# Patient Record
Sex: Male | Born: 1945 | Race: White | Hispanic: No | Marital: Married | Smoking: Current some day smoker
Health system: Southern US, Community
[De-identification: ages and names within clinical notes are randomized; demographics above are authoritative.]

## PROBLEM LIST (undated history)

## (undated) DIAGNOSIS — D649 Anemia, unspecified: Secondary | ICD-10-CM

## (undated) DIAGNOSIS — M545 Low back pain, unspecified: Secondary | ICD-10-CM

## (undated) DIAGNOSIS — M109 Gout, unspecified: Secondary | ICD-10-CM

## (undated) DIAGNOSIS — I10 Essential (primary) hypertension: Secondary | ICD-10-CM

## (undated) DIAGNOSIS — G4733 Obstructive sleep apnea (adult) (pediatric): Secondary | ICD-10-CM

## (undated) DIAGNOSIS — T4145XA Adverse effect of unspecified anesthetic, initial encounter: Secondary | ICD-10-CM

## (undated) DIAGNOSIS — E119 Type 2 diabetes mellitus without complications: Secondary | ICD-10-CM

## (undated) DIAGNOSIS — T8859XA Other complications of anesthesia, initial encounter: Secondary | ICD-10-CM

## (undated) DIAGNOSIS — I2510 Atherosclerotic heart disease of native coronary artery without angina pectoris: Secondary | ICD-10-CM

## (undated) DIAGNOSIS — G8929 Other chronic pain: Secondary | ICD-10-CM

## (undated) DIAGNOSIS — M1990 Unspecified osteoarthritis, unspecified site: Secondary | ICD-10-CM

## (undated) DIAGNOSIS — S02609A Fracture of mandible, unspecified, initial encounter for closed fracture: Secondary | ICD-10-CM

## (undated) DIAGNOSIS — Z8711 Personal history of peptic ulcer disease: Secondary | ICD-10-CM

## (undated) DIAGNOSIS — Z9289 Personal history of other medical treatment: Secondary | ICD-10-CM

## (undated) DIAGNOSIS — Z9989 Dependence on other enabling machines and devices: Secondary | ICD-10-CM

## (undated) DIAGNOSIS — Z8719 Personal history of other diseases of the digestive system: Secondary | ICD-10-CM

## (undated) DIAGNOSIS — J189 Pneumonia, unspecified organism: Secondary | ICD-10-CM

## (undated) HISTORY — PX: BACK SURGERY: SHX140

## (undated) HISTORY — PX: COLON SURGERY: SHX602

## (undated) HISTORY — DX: Essential (primary) hypertension: I10

## (undated) HISTORY — PX: CORNEAL TRANSPLANT: SHX108

## (undated) HISTORY — PX: ELBOW SURGERY: SHX618

## (undated) HISTORY — PX: HEMICOLECTOMY: SHX854

## (undated) HISTORY — DX: Atherosclerotic heart disease of native coronary artery without angina pectoris: I25.10

## (undated) HISTORY — PX: DEBRIDEMENT OF ABDOMINAL WALL ABSCESS: SHX6396

## (undated) HISTORY — PX: WRIST SURGERY: SHX841

---

## 1997-03-19 HISTORY — PX: CORONARY ARTERY BYPASS GRAFT: SHX141

## 1997-07-12 ENCOUNTER — Inpatient Hospital Stay (HOSPITAL_COMMUNITY): Admission: EM | Admit: 1997-07-12 | Discharge: 1997-07-22 | Payer: Self-pay | Admitting: Emergency Medicine

## 1998-11-18 HISTORY — PX: LUMBAR SPINE SURGERY: SHX701

## 1999-03-08 ENCOUNTER — Emergency Department (HOSPITAL_COMMUNITY): Admission: EM | Admit: 1999-03-08 | Discharge: 1999-03-08 | Payer: Self-pay | Admitting: Emergency Medicine

## 1999-03-08 ENCOUNTER — Encounter: Payer: Self-pay | Admitting: Emergency Medicine

## 1999-06-27 ENCOUNTER — Inpatient Hospital Stay (HOSPITAL_COMMUNITY): Admission: EM | Admit: 1999-06-27 | Discharge: 1999-06-30 | Payer: Self-pay | Admitting: *Deleted

## 2000-11-04 ENCOUNTER — Encounter: Payer: Self-pay | Admitting: Gastroenterology

## 2000-11-04 ENCOUNTER — Ambulatory Visit (HOSPITAL_COMMUNITY): Admission: RE | Admit: 2000-11-04 | Discharge: 2000-11-04 | Payer: Self-pay | Admitting: Gastroenterology

## 2000-11-05 ENCOUNTER — Inpatient Hospital Stay (HOSPITAL_COMMUNITY): Admission: EM | Admit: 2000-11-05 | Discharge: 2000-11-14 | Payer: Self-pay | Admitting: Gastroenterology

## 2000-11-08 ENCOUNTER — Encounter: Payer: Self-pay | Admitting: Gastroenterology

## 2000-11-13 ENCOUNTER — Encounter (HOSPITAL_BASED_OUTPATIENT_CLINIC_OR_DEPARTMENT_OTHER): Payer: Self-pay | Admitting: General Surgery

## 2000-11-21 ENCOUNTER — Encounter (HOSPITAL_BASED_OUTPATIENT_CLINIC_OR_DEPARTMENT_OTHER): Payer: Self-pay | Admitting: General Surgery

## 2000-11-21 ENCOUNTER — Ambulatory Visit (HOSPITAL_COMMUNITY): Admission: RE | Admit: 2000-11-21 | Discharge: 2000-11-21 | Payer: Self-pay | Admitting: General Surgery

## 2000-12-10 ENCOUNTER — Encounter (INDEPENDENT_AMBULATORY_CARE_PROVIDER_SITE_OTHER): Payer: Self-pay | Admitting: *Deleted

## 2000-12-10 ENCOUNTER — Other Ambulatory Visit: Admission: RE | Admit: 2000-12-10 | Discharge: 2000-12-10 | Payer: Self-pay | Admitting: Gastroenterology

## 2000-12-12 ENCOUNTER — Encounter (HOSPITAL_BASED_OUTPATIENT_CLINIC_OR_DEPARTMENT_OTHER): Payer: Self-pay | Admitting: General Surgery

## 2000-12-16 ENCOUNTER — Encounter (INDEPENDENT_AMBULATORY_CARE_PROVIDER_SITE_OTHER): Payer: Self-pay | Admitting: Specialist

## 2000-12-16 ENCOUNTER — Inpatient Hospital Stay (HOSPITAL_COMMUNITY): Admission: RE | Admit: 2000-12-16 | Discharge: 2000-12-24 | Payer: Self-pay | Admitting: General Surgery

## 2001-12-16 ENCOUNTER — Encounter: Admission: RE | Admit: 2001-12-16 | Discharge: 2001-12-16 | Payer: Self-pay

## 2001-12-17 ENCOUNTER — Inpatient Hospital Stay (HOSPITAL_COMMUNITY): Admission: AD | Admit: 2001-12-17 | Discharge: 2001-12-20 | Payer: Self-pay | Admitting: General Surgery

## 2001-12-18 ENCOUNTER — Encounter (HOSPITAL_BASED_OUTPATIENT_CLINIC_OR_DEPARTMENT_OTHER): Payer: Self-pay | Admitting: General Surgery

## 2001-12-26 ENCOUNTER — Encounter (HOSPITAL_BASED_OUTPATIENT_CLINIC_OR_DEPARTMENT_OTHER): Payer: Self-pay | Admitting: General Surgery

## 2001-12-26 ENCOUNTER — Ambulatory Visit (HOSPITAL_COMMUNITY): Admission: RE | Admit: 2001-12-26 | Discharge: 2001-12-26 | Payer: Self-pay | Admitting: General Surgery

## 2002-01-23 ENCOUNTER — Ambulatory Visit (HOSPITAL_COMMUNITY): Admission: RE | Admit: 2002-01-23 | Discharge: 2002-01-23 | Payer: Self-pay | Admitting: Gastroenterology

## 2002-01-23 ENCOUNTER — Encounter: Payer: Self-pay | Admitting: Gastroenterology

## 2002-03-17 ENCOUNTER — Encounter (HOSPITAL_BASED_OUTPATIENT_CLINIC_OR_DEPARTMENT_OTHER): Payer: Self-pay | Admitting: General Surgery

## 2002-03-17 ENCOUNTER — Ambulatory Visit (HOSPITAL_COMMUNITY): Admission: RE | Admit: 2002-03-17 | Discharge: 2002-03-17 | Payer: Self-pay | Admitting: General Surgery

## 2002-04-27 ENCOUNTER — Emergency Department (HOSPITAL_COMMUNITY): Admission: EM | Admit: 2002-04-27 | Discharge: 2002-04-27 | Payer: Self-pay | Admitting: Emergency Medicine

## 2002-04-27 ENCOUNTER — Encounter (HOSPITAL_BASED_OUTPATIENT_CLINIC_OR_DEPARTMENT_OTHER): Payer: Self-pay | Admitting: General Surgery

## 2002-05-08 ENCOUNTER — Ambulatory Visit (HOSPITAL_COMMUNITY): Admission: RE | Admit: 2002-05-08 | Discharge: 2002-05-08 | Payer: Self-pay | Admitting: General Surgery

## 2002-05-08 ENCOUNTER — Encounter (HOSPITAL_BASED_OUTPATIENT_CLINIC_OR_DEPARTMENT_OTHER): Payer: Self-pay | Admitting: General Surgery

## 2002-05-08 ENCOUNTER — Encounter (INDEPENDENT_AMBULATORY_CARE_PROVIDER_SITE_OTHER): Payer: Self-pay | Admitting: *Deleted

## 2002-05-13 ENCOUNTER — Encounter (HOSPITAL_BASED_OUTPATIENT_CLINIC_OR_DEPARTMENT_OTHER): Payer: Self-pay | Admitting: General Surgery

## 2002-05-13 ENCOUNTER — Ambulatory Visit (HOSPITAL_COMMUNITY): Admission: RE | Admit: 2002-05-13 | Discharge: 2002-05-13 | Payer: Self-pay | Admitting: General Surgery

## 2002-11-26 ENCOUNTER — Encounter: Payer: Self-pay | Admitting: Emergency Medicine

## 2002-11-26 ENCOUNTER — Inpatient Hospital Stay (HOSPITAL_COMMUNITY): Admission: EM | Admit: 2002-11-26 | Discharge: 2002-11-28 | Payer: Self-pay | Admitting: Emergency Medicine

## 2003-01-11 ENCOUNTER — Encounter: Payer: Self-pay | Admitting: Gastroenterology

## 2003-01-11 ENCOUNTER — Ambulatory Visit (HOSPITAL_COMMUNITY): Admission: RE | Admit: 2003-01-11 | Discharge: 2003-01-11 | Payer: Self-pay | Admitting: Gastroenterology

## 2003-01-12 ENCOUNTER — Ambulatory Visit (HOSPITAL_COMMUNITY): Admission: RE | Admit: 2003-01-12 | Discharge: 2003-01-12 | Payer: Self-pay | Admitting: Gastroenterology

## 2004-01-13 ENCOUNTER — Encounter: Admission: RE | Admit: 2004-01-13 | Discharge: 2004-01-13 | Payer: Self-pay | Admitting: Family Medicine

## 2004-03-22 ENCOUNTER — Ambulatory Visit: Payer: Self-pay | Admitting: Family Medicine

## 2004-04-26 ENCOUNTER — Ambulatory Visit: Payer: Self-pay | Admitting: Family Medicine

## 2004-05-10 ENCOUNTER — Ambulatory Visit: Payer: Self-pay | Admitting: Family Medicine

## 2004-10-25 ENCOUNTER — Ambulatory Visit: Payer: Self-pay | Admitting: Cardiology

## 2004-10-26 ENCOUNTER — Ambulatory Visit: Payer: Self-pay

## 2004-11-23 ENCOUNTER — Ambulatory Visit: Payer: Self-pay | Admitting: Pulmonary Disease

## 2004-11-23 ENCOUNTER — Ambulatory Visit (HOSPITAL_COMMUNITY): Admission: RE | Admit: 2004-11-23 | Discharge: 2004-11-23 | Payer: Self-pay | Admitting: Orthopedic Surgery

## 2004-11-23 ENCOUNTER — Ambulatory Visit: Payer: Self-pay | Admitting: Family Medicine

## 2004-11-24 ENCOUNTER — Ambulatory Visit: Payer: Self-pay | Admitting: Family Medicine

## 2004-11-29 ENCOUNTER — Ambulatory Visit: Payer: Self-pay | Admitting: Family Medicine

## 2004-12-20 ENCOUNTER — Ambulatory Visit (HOSPITAL_COMMUNITY): Admission: RE | Admit: 2004-12-20 | Discharge: 2004-12-21 | Payer: Self-pay | Admitting: Orthopedic Surgery

## 2005-01-24 ENCOUNTER — Ambulatory Visit: Payer: Self-pay | Admitting: Family Medicine

## 2005-02-01 ENCOUNTER — Encounter: Admission: RE | Admit: 2005-02-01 | Discharge: 2005-02-01 | Payer: Self-pay | Admitting: Orthopedic Surgery

## 2005-03-06 ENCOUNTER — Ambulatory Visit: Payer: Self-pay | Admitting: Gastroenterology

## 2005-03-20 ENCOUNTER — Ambulatory Visit: Payer: Self-pay | Admitting: Family Medicine

## 2005-03-26 ENCOUNTER — Encounter (INDEPENDENT_AMBULATORY_CARE_PROVIDER_SITE_OTHER): Payer: Self-pay | Admitting: *Deleted

## 2005-03-26 ENCOUNTER — Inpatient Hospital Stay (HOSPITAL_COMMUNITY): Admission: RE | Admit: 2005-03-26 | Discharge: 2005-04-02 | Payer: Self-pay | Admitting: Orthopedic Surgery

## 2005-04-12 ENCOUNTER — Ambulatory Visit: Payer: Self-pay | Admitting: Family Medicine

## 2005-07-24 ENCOUNTER — Ambulatory Visit: Payer: Self-pay | Admitting: Family Medicine

## 2005-10-01 ENCOUNTER — Ambulatory Visit: Payer: Self-pay | Admitting: Family Medicine

## 2005-11-06 ENCOUNTER — Ambulatory Visit: Payer: Self-pay | Admitting: Family Medicine

## 2006-04-24 ENCOUNTER — Ambulatory Visit: Payer: Self-pay | Admitting: Family Medicine

## 2006-04-24 LAB — CONVERTED CEMR LAB
ALT: 25 units/L (ref 0–40)
AST: 41 units/L — ABNORMAL HIGH (ref 0–37)
BUN: 17 mg/dL (ref 6–23)
Basophils Absolute: 0.1 10*3/uL (ref 0.0–0.1)
Basophils Relative: 0.9 % (ref 0.0–1.0)
CO2: 26 meq/L (ref 19–32)
Calcium: 9.1 mg/dL (ref 8.4–10.5)
Chloride: 107 meq/L (ref 96–112)
Creatinine, Ser: 1 mg/dL (ref 0.4–1.5)
Creatinine,U: 124 mg/dL
Eosinophils Absolute: 0.3 10*3/uL (ref 0.0–0.6)
Eosinophils Relative: 3.4 % (ref 0.0–5.0)
GFR calc Af Amer: 99 mL/min
GFR calc non Af Amer: 82 mL/min
Glucose, Bld: 81 mg/dL (ref 70–99)
HCT: 39.3 % (ref 39.0–52.0)
Hemoglobin: 13.6 g/dL (ref 13.0–17.0)
Hgb A1c MFr Bld: 8.6 % — ABNORMAL HIGH (ref 4.6–6.0)
Lymphocytes Relative: 25.8 % (ref 12.0–46.0)
MCHC: 34.5 g/dL (ref 30.0–36.0)
MCV: 88.9 fL (ref 78.0–100.0)
Microalb Creat Ratio: 660.5 mg/g — ABNORMAL HIGH (ref 0.0–30.0)
Microalb, Ur: 81.9 mg/dL (ref 0.0–1.9)
Monocytes Absolute: 0.8 10*3/uL — ABNORMAL HIGH (ref 0.2–0.7)
Monocytes Relative: 10.6 % (ref 3.0–11.0)
Neutro Abs: 4.4 10*3/uL (ref 1.4–7.7)
Neutrophils Relative %: 59.3 % (ref 43.0–77.0)
PSA: 0.5 ng/mL (ref 0.10–4.00)
Platelets: 198 10*3/uL (ref 150–400)
Potassium: 4 meq/L (ref 3.5–5.1)
RBC: 4.42 M/uL (ref 4.22–5.81)
RDW: 14.3 % (ref 11.5–14.6)
Sodium: 140 meq/L (ref 135–145)
TSH: 2.83 microintl units/mL (ref 0.35–5.50)
Uric Acid, Serum: 6.1 mg/dL (ref 2.4–7.0)
Vitamin B-12: 327 pg/mL (ref 211–911)
WBC: 7.6 10*3/uL (ref 4.5–10.5)

## 2006-11-04 ENCOUNTER — Encounter: Payer: Self-pay | Admitting: Family Medicine

## 2006-11-04 DIAGNOSIS — K219 Gastro-esophageal reflux disease without esophagitis: Secondary | ICD-10-CM

## 2006-11-04 DIAGNOSIS — E785 Hyperlipidemia, unspecified: Secondary | ICD-10-CM

## 2006-11-04 DIAGNOSIS — K5730 Diverticulosis of large intestine without perforation or abscess without bleeding: Secondary | ICD-10-CM | POA: Insufficient documentation

## 2006-11-04 DIAGNOSIS — E119 Type 2 diabetes mellitus without complications: Secondary | ICD-10-CM

## 2006-11-04 DIAGNOSIS — I10 Essential (primary) hypertension: Secondary | ICD-10-CM | POA: Insufficient documentation

## 2006-11-07 ENCOUNTER — Ambulatory Visit: Payer: Self-pay | Admitting: Family Medicine

## 2006-11-07 DIAGNOSIS — G4730 Sleep apnea, unspecified: Secondary | ICD-10-CM | POA: Insufficient documentation

## 2006-11-07 DIAGNOSIS — E1149 Type 2 diabetes mellitus with other diabetic neurological complication: Secondary | ICD-10-CM

## 2006-11-08 LAB — CONVERTED CEMR LAB
ALT: 20 units/L (ref 0–53)
AST: 30 units/L (ref 0–37)
Albumin: 3.7 g/dL (ref 3.5–5.2)
Alkaline Phosphatase: 84 units/L (ref 39–117)
Bilirubin, Direct: 0.1 mg/dL (ref 0.0–0.3)
Cholesterol: 161 mg/dL (ref 0–200)
Direct LDL: 98.4 mg/dL
HDL: 25 mg/dL — ABNORMAL LOW (ref >39.0)
Hgb A1c MFr Bld: 9.1 % — ABNORMAL HIGH (ref 4.6–6.0)
Total Bilirubin: 0.6 mg/dL (ref 0.3–1.2)
Total CHOL/HDL Ratio: 6.4
Total Protein: 6.6 g/dL (ref 6.0–8.3)
Triglycerides: 244 mg/dL (ref 0–149)
VLDL: 49 mg/dL — ABNORMAL HIGH (ref 0–40)

## 2006-11-21 ENCOUNTER — Encounter: Payer: Self-pay | Admitting: Family Medicine

## 2006-11-21 ENCOUNTER — Ambulatory Visit (HOSPITAL_BASED_OUTPATIENT_CLINIC_OR_DEPARTMENT_OTHER): Admission: RE | Admit: 2006-11-21 | Discharge: 2006-11-21 | Payer: Self-pay | Admitting: Family Medicine

## 2006-12-04 ENCOUNTER — Ambulatory Visit: Payer: Self-pay | Admitting: Pulmonary Disease

## 2006-12-24 ENCOUNTER — Telehealth: Payer: Self-pay | Admitting: Family Medicine

## 2007-01-02 ENCOUNTER — Ambulatory Visit: Payer: Self-pay | Admitting: Pulmonary Disease

## 2007-01-30 ENCOUNTER — Ambulatory Visit: Payer: Self-pay | Admitting: Pulmonary Disease

## 2007-01-30 DIAGNOSIS — G4733 Obstructive sleep apnea (adult) (pediatric): Secondary | ICD-10-CM

## 2007-04-10 ENCOUNTER — Ambulatory Visit: Payer: Self-pay | Admitting: Family Medicine

## 2007-04-10 DIAGNOSIS — N401 Enlarged prostate with lower urinary tract symptoms: Secondary | ICD-10-CM

## 2007-04-10 DIAGNOSIS — N390 Urinary tract infection, site not specified: Secondary | ICD-10-CM | POA: Insufficient documentation

## 2007-04-10 DIAGNOSIS — N410 Acute prostatitis: Secondary | ICD-10-CM

## 2007-04-10 LAB — CONVERTED CEMR LAB
Blood in Urine, dipstick: NEGATIVE
Nitrite: NEGATIVE
Specific Gravity, Urine: 1.025
Urobilinogen, UA: 0.2
WBC Urine, dipstick: NEGATIVE
pH: 5.5

## 2007-04-15 ENCOUNTER — Telehealth: Payer: Self-pay | Admitting: Family Medicine

## 2007-04-17 ENCOUNTER — Ambulatory Visit: Payer: Self-pay | Admitting: Gastroenterology

## 2007-04-24 ENCOUNTER — Ambulatory Visit: Payer: Self-pay | Admitting: Gastroenterology

## 2007-04-24 ENCOUNTER — Encounter: Payer: Self-pay | Admitting: Family Medicine

## 2007-04-29 ENCOUNTER — Ambulatory Visit: Payer: Self-pay | Admitting: Family Medicine

## 2007-04-29 DIAGNOSIS — E663 Overweight: Secondary | ICD-10-CM | POA: Insufficient documentation

## 2007-05-08 LAB — CONVERTED CEMR LAB
BUN: 12 mg/dL (ref 6–23)
CO2: 30 meq/L (ref 19–32)
Calcium: 9.6 mg/dL (ref 8.4–10.5)
Chloride: 102 meq/L (ref 96–112)
Cholesterol: 177 mg/dL (ref 0–200)
Creatinine, Ser: 1.2 mg/dL (ref 0.4–1.5)
Direct LDL: 93.6 mg/dL
GFR calc Af Amer: 80 mL/min
GFR calc non Af Amer: 66 mL/min
Glucose, Bld: 300 mg/dL — ABNORMAL HIGH (ref 70–99)
HDL: 31 mg/dL — ABNORMAL LOW (ref >39.0)
Hgb A1c MFr Bld: 14.6 % — ABNORMAL HIGH (ref 4.6–6.0)
PSA: 0.52 ng/mL (ref 0.10–4.00)
Potassium: 5.2 meq/L — ABNORMAL HIGH (ref 3.5–5.1)
Sodium: 139 meq/L (ref 135–145)
Total CHOL/HDL Ratio: 5.7
Triglycerides: 247 mg/dL (ref 0–149)
VLDL: 49 mg/dL — ABNORMAL HIGH (ref 0–40)

## 2007-05-21 ENCOUNTER — Telehealth: Payer: Self-pay | Admitting: Family Medicine

## 2007-05-28 ENCOUNTER — Encounter: Admission: RE | Admit: 2007-05-28 | Discharge: 2007-06-19 | Payer: Self-pay | Admitting: Endocrinology

## 2007-06-17 ENCOUNTER — Ambulatory Visit: Payer: Self-pay | Admitting: Family Medicine

## 2007-06-17 DIAGNOSIS — J189 Pneumonia, unspecified organism: Secondary | ICD-10-CM

## 2007-06-18 ENCOUNTER — Inpatient Hospital Stay (HOSPITAL_COMMUNITY): Admission: EM | Admit: 2007-06-18 | Discharge: 2007-06-22 | Payer: Self-pay | Admitting: Emergency Medicine

## 2007-06-18 ENCOUNTER — Telehealth: Payer: Self-pay | Admitting: Family Medicine

## 2007-06-18 ENCOUNTER — Ambulatory Visit: Payer: Self-pay | Admitting: Family Medicine

## 2007-06-18 ENCOUNTER — Ambulatory Visit: Payer: Self-pay | Admitting: Internal Medicine

## 2007-06-18 DIAGNOSIS — R091 Pleurisy: Secondary | ICD-10-CM | POA: Insufficient documentation

## 2007-06-18 DIAGNOSIS — J159 Unspecified bacterial pneumonia: Secondary | ICD-10-CM | POA: Insufficient documentation

## 2007-06-25 ENCOUNTER — Telehealth: Payer: Self-pay | Admitting: Family Medicine

## 2007-06-26 ENCOUNTER — Ambulatory Visit: Payer: Self-pay | Admitting: Family Medicine

## 2007-06-26 DIAGNOSIS — J984 Other disorders of lung: Secondary | ICD-10-CM

## 2007-07-25 ENCOUNTER — Encounter: Payer: Self-pay | Admitting: Family Medicine

## 2008-01-26 ENCOUNTER — Encounter: Payer: Self-pay | Admitting: Family Medicine

## 2010-04-08 ENCOUNTER — Encounter: Payer: Self-pay | Admitting: Gastroenterology

## 2010-04-08 ENCOUNTER — Encounter: Payer: Self-pay | Admitting: Orthopedic Surgery

## 2010-08-01 NOTE — Procedures (Signed)
NAMEMARKAS, ALDREDGE                  ACCOUNT NO.:  [id number]   MEDICAL RECORD NO.:  [id number]          PATIENT TYPE:  OUT   LOCATION:  SLEEP CENTER                 FACILITY:  [facility]   PHYSICIAN:  Barbaraann Share, MD,FCCPDATE OF BIRTH:  Mar 13[birth year] 1948   DATE OF STUDY:  11/21/2006                            NOCTURNAL POLYSOMNOGRAM   REFERRING PHYSICIAN:   REFERRING PHYSICIAN:  Tawny Asal, M.D.   INDICATION FOR STUDY:  Hypersomnia with sleep apnea.   EPWORTH SLEEPINESS SCORE:  17   MEDICATIONS:   SLEEP ARCHITECTURE:  The patient had a total sleep time of 316 minutes  with no slow wave sleep and on 31 minutes of REM.  Sleep onset latency  was prolonged at 39 minutes and REM onset was fairly rapid at 28  minutes.  Sleep efficiency was decreased at 83%.   RESPIRATORY DATA:  The patient was found to have 310 obstructive events  which were not positional in nature.  Very loud snoring was noted  throughout.   OXYGEN DATA:  There was O2 desaturation as low as 71% with the patient's  obstructive events.   CARDIAC DATA:  Occasional PAC's, but no clinically significant  arrhythmia.   MOVEMENT-PARASOMNIA:  Moderate numbers of leg jerks with no significant  sleep disruption.   IMPRESSIONS-RECOMMENDATIONS:  1. Severe obstructive sleep apnea with an apnea/hypopnea index of 59      events per hour and O2 desaturation as low as 71%.  Treatment for      this degree of sleep apnea should focus primarily on weight loss as      well as CPAP.  2. Occasional PAC's but no clinically significant arrhythmias were      noted.     Barbaraann Share, MD,FCCP  Diplomate, American Board of Sleep  Medicine  Electronically Signed    [facility]/MEDQ  D:  12/04/2006 08:31:11  T:  12/04/2006 12:01:04  Job:  [id number]

## 2010-08-01 NOTE — Discharge Summary (Signed)
NAMELERON, STOFFERS                  ACCOUNT NO.:  [id number]   MEDICAL RECORD NO.:  [id number]          PATIENT TYPE:  INP   LOCATION:  4704                         FACILITY:  [facility]   PHYSICIAN:  Willow Ora, MD           DATE OF BIRTH:  24-Mar-1945   DATE OF ADMISSION:  06/18/2007  DATE OF DISCHARGE:  06/22/2007                               DISCHARGE SUMMARY   BRIEF HISTORY AND PHYSICAL:  Mr. Merfeld is a 65 year old gentleman with  history of hypertension, coronary artery disease, diabetes, and  hyperlipidemia who was sent to the emergency room by his primary care  doctor with fever, productive cough, and malaise for several days.  Chest x-ray done at the office revealed a left lower lobe pneumonia.   PHYSICAL EXAMINATION:  GENERAL:  The patient was alert, oriented, in no  apparent distress.  VITAL SIGNS:  Blood pressure 158/89, heart rate 93, respiratory rate 24,  temperature 100.2, and O2 saturation 92% on room air.  LUNGS:  Scattered rhonchi with end-expiratory wheezing.  CARDIOVASCULAR:  Regular rate and rhythm without murmur.  ABDOMEN:  Obese, nontender, and nondistended.   LABORATORY AND X-RAYS:  Initial white count was 10.6 with a hemoglobin  of 14.0 and platelets of 153.  At the time of discharge, his hemoglobin  was 12 and the white blood count was 7.5.  Potassium 3.8, creatinine  1.29, blood sugar was 413.  LFTs were normal, three sets of cardiac  enzymes were negative.  His total cholesterol was 114, LDL 49, and HDL  22.  Uric acid level was 5.7, BNP was 90, A1c was 11.6.  Report of the  chest x-ray is as follows, he has infiltrate at the left base and a  stable small nodular density at the left upper lobe.   HOSPITAL COURSE:  1. The patient was admitted to the hospital with pneumonia.  He was      started on IV Rocephin and Zithromax.  His hospital stay was      unremarkable.  We noted the chest x-ray not only to show a      pneumonia but a nodular density.  He was  eventually switched to      p.o. antibiotics and doing well.  He is stable from that standpoint      and will be discharged home today.  2. Diabetes.  His diabetes is chronically under poor control.  The      patient stated that before admission, his endocrinologist started      on a shot probably Byetta; however, in the hospital, his sugars      were in the 300 range.  We started him on Lantus 10 units and      escalated the dose to 25 units.  Despite this increased dose of      Lantus, his sugar remain in the 300 range, and this will have to be      followed up as an outpatient.  3. He has a history of hypertension and this has been stable.  4.  History of coronary artery disease and this has been stable.  5. History of sleep apnea.  We will continue with CPAP upon admission.  6. The day before admission, he developed podagra on the right side.      He states that he had gout before.  He was started on Indocin and      colchicine.  By the time of rounds this morning, the base of this      right first great toe was slightly red and tender.  He will be      discharged on colchicine and Indocin.  7. He will be discharged with the following instructions,      a.     Simvastatin 80 mg once a day.      b.     Aspirin 325 mg once a day.      c.     Metoprolol 200 mg half tablet twice a day.      d.     Flomax once a day.      e.     Lyrica 300 mg twice a day.      f.     Mucinex DM twice a day for 1 week.      g.     Lantus 24 units daily.      h.     Albuterol 2 puffs every 4 hours as needed for wheezing.      i.     Metformin 500 mg twice a day.      j.     Ceftin 500 mg one tablet twice a day for 1 week.      k.     Colchicine 0.6 mg once every 3 hours as needed for gout.      l.     Indocin 25 mg one every 8 hours as needed for gout.      m.     CPAP as before.      n.     Come back to the ER if he feels short of breath or has high       fever.      o.     He is advised to call Dr.  Charmian Muff office tomorrow and set       up an appointment to be seen in the next few days.   ADMITTING DIAGNOSIS:  Pneumonia.   DISCHARGE DIAGNOSES:  1. Pneumonia, now on p.o. antibiotics.  2. Abnormal chest x-ray, not only he has an infiltrate, but he also      has left upper lobe nodular density that needs to be followed up as      an outpatient.  3. Diabetes, poorly controlled.  4. Hypertension, coronary artery disease, stable.  5. Episode of gout while in the hospital.  6. High cholesterol, well controlled except for HDL that is low.      Willow Ora, MD  Electronically Signed     JP/MEDQ  D:  06/22/2007  T:  06/22/2007  Job:  [id number]

## 2010-08-01 NOTE — Assessment & Plan Note (Signed)
[facility]                             PULMONARY OFFICE NOTE   NAME:[name], [name]                         MRN:          [id number]  DATE:01/02/2007                            DOB:          Apr 10, 1945    SLEEP MEDICINE CONSULTATION   HISTORY OF PRESENT ILLNESS:  The patient is a 65 year old male, who I  have been asked to see for obstructive sleep apnea.  The patient  underwent nocturnal polysomnography on November 21, 2006, where he was  found to have severe obstructive sleep apnea with a respiratory  disturbance index of 59 events per hour and O2 desaturation as low as  71%.  The patient states that he does have snoring and has been told  that he has pauses in his breathing during sleep.  He typically gets to  be bed between 8 and 10 and gets up at 6:15 to start his day, he is not  rested upon arising, he does note frequent awakenings, but no jerking  arousals.  The patient works as an Chief Operating Officer.  He will doze off at work very quickly with any period of  inactivity, and also cannot stay awake during meetings.  He will fall  asleep with TV and movies.  The patient states he has even fallen asleep  in Court.  He also has sleepiness with driving.  Of note, his weight is  up about 35 pounds over the last two years.   PAST MEDICAL HISTORY:  1. Hypertension.  2. Diabetes.  3. Dyslipidemia.  4. History of multiple spine surgeries.  5. History of CABG in 1997.   CURRENT MEDICATIONS:  1. Aspirin 325 daily.  2. Metoprolol-ER 200 mg one-half daily.  3. Jenuvia 100 mg daily.  4. Avandaryl 4/4 one b.i.d.  5. Simvastatin 80 mg q.h.s.  6. B-12 shots monthly.   The patient has INTOLERANCE TO MORPHINE, which causes a rash.   SOCIAL HISTORY:  He is married and has no children, he has a history of  smoking one pack per day for 40 years, he continues to smoke.   FAMILY HISTORY:  Unknown since he is adopted.   REVIEW OF  SYSTEMS:  As per History of Present Illness, also see Patient  Intake Form documented in the chart.   PHYSICAL EXAMINATION:  GENERAL:  He is an overweight male in no acute  distress.  VITAL SIGNS:  Blood pressure is 130/74, pulse 64, temperature is 98,  weight is 219 pounds, he is 5 foot 10 inches tall, O2 saturation on room  air is 92%.  HEENT:  Pupils equal, round, and reactive to light and accommodation,  extraocular muscles are intact, nares are somewhat narrowed but patent  with deviated septum to the left, oropharynx shows significant  elongation of the soft palate and uvula.  NECK:  Supple without JVD or lymphadenopathy, there is no palpable  thyromegaly.  CHEST:  Clear except for a few rhonchi.  CARDIAC:  Regular rate and rhythm.  ABDOMEN:  Soft and nontender with good bowel sounds.  GENITAL  EXAM, RECTAL EXAM, BREAST EXAM:  Not done and not indicated.  LOWER EXTREMITIES:  Without edema, pulses were intact distally.  NEUROLOGIC:  Alert and oriented with no obvious motor deficits.   IMPRESSION:  Severe obstructive sleep apnea documented by nocturnal  polysomnography.  The patient clearly is very symptomatic during the  day, is overweight, and has abnormal upper airway anatomy.  I had a long  discussion with him about the pathophysiology of sleep apnea including  the short-term quality of life issues and the long-term cardiovascular  issues.  At this point in time, I really think his best options for  treatment would be continuous positive airway pressure (CPAP) coupled  with weight loss.  The patient is agreeable to this.   PLAN:  1. Will initiate CPAP at 10 cm of water pressure.  2. Work on weight loss.  3. The patient will follow up in four weeks where we will work on      pressure optimization.  4. I have reminded the patient of his moral responsibility to not      drive if he is sleepy.     Barbaraann Share, MD,FCCP  Electronically Signed    [facility]/MedQ  DD:  01/02/2007  DT: 01/03/2007  Job #: [id number]   cc:   Ellin Saba., MD

## 2010-08-04 NOTE — Discharge Summary (Signed)
[facility]  Patient:    [name], [name] Visit Number: [id number] MRN: [id number]          Service Type: SUR Location: [id number] Attending Physician:  Sonda Primes Dictated by:   Mardene Celeste Lurene Shadow, M.D. Admit Date:  12/16/2000 Discharge Date: 12/24/2000                             Discharge Summary  ADMISSION DIAGNOSES: 1. Diverticulitis with diverticular abscess. 2. Hypertension.  DISCHARGE DIAGNOSES: 1. Diverticulitis with diverticular disease. 2. Hypertension.  PROCEDURES: 1. Insertion of bilateral ureteral catheters. 2. Rectosigmoid colectomy with low colorectal anastomosis.  COMPLICATIONS:  None.  CONDITION ON DISCHARGE:  Improved.  HISTORY OF PRESENT ILLNESS:  The patient is a 65 year old man with multiple episodes of recurrent diverticulitis.  On his last episode, he had had a perforation with a mesenteric abscess.  Following percutaneous drainage of the abscess, the patient was prepared and then brought to the operating room for exploration.  HOSPITAL COURSE:  On the day of admission, he was taken to the operating room where he underwent placement of bilateral ureteral catheters for protection of the ureters during dissection.  He had excision of the sigmoid colon and an end-to-end colorectal anastomosis.  His postoperative course has been benign with the exception of some mild nausea postoperatively and some mild to moderate anorexia.  At the time of discharge, he is tolerating a regular diet and having normal bowel movements.  His wounds are healing satisfactorily.  He will be followed up in the office in two weeks.  DISCHARGE MEDICATION:  Mepergan Fortis one to two every four hours p.r.n. pain.  ACTIVITY:  As tolerated.  DIET:  Unrestricted.  Dictated by:   Mardene Celeste. Lurene Shadow, M.D. Attending Physician:  Sonda Primes DD:  12/24/00 TD:  12/25/00 Job: [id number]

## 2010-08-04 NOTE — Consult Note (Signed)
[name], [name]                  ACCOUNT NO.:  [id number]   MEDICAL RECORD NO.:  [id number]          PATIENT TYPE:  INP   LOCATION:  2014                         FACILITY:  [facility]   PHYSICIAN:  Nelda Severe, MD      DATE OF BIRTH:  10/03/1945   DATE OF CONSULTATION:  03/30/2005  DATE OF DISCHARGE:                                   CONSULTATION   PROGRESS NOTE:  This gentleman was admitted on March 26, 2005 for a lumbar disk excision.  He had suffered a disk herniation after he had a laminectomy for spinal  stenosis and developed recurrent left sciatic pain. He was taken to the  operating room on March 26, 2005,  where an uneventful left-sided L4-5 disk  excision was carried out for a moderately large disk protrusion which was  contained distal to the disk space, deep to posterior longitudinal ligament.  Postoperatively he did fine until the night of January 10 and into the  morning of January 11 when he developed severe recurrent left sciatic pain.  The night previously his drain had become disconnected, clotted and was  removed.  It is noteworthy that he had a fair amount of oozing at the time  of his surgery because his wound was only about 2 months old status post  previous laminectomy.   In any event an MRI scan was done yesterday, March 29, 2005 and shows what  is probably a epidural hematoma/seroma with severe cauda equina compression  at the level of the previous laminectomy.   In terms of his left lower extremity, there is on deficit in motor strength  on examination.  The main problem is one of pain.   Therefore we have recommended to him and he has agreed to undergo the  exploration laminectomy today, March 30, 2005.  Although it is quite  possible that his hematoma/seroma may resorb over the ensuing week, and  there is no absolute indication for surgery given the lack of neurologic  deficit.  I think that it is prudent to proceed today.  I have explained to  him  that the surgery at this time, four days postoperative will be fairly  easy but that if it should become necessary at 14 days postoperatively it  will be quite difficult because of that stage of wound healing there will be  a tremendous amount of vascular tissue and the wound would be quite stuck  together.   Therefore we are proceeding today with his surgery.  He understands what we  are going to do and the rationale and is agreement.      Nelda Severe, MD  Electronically Signed     MT/MEDQ  D:  03/30/2005  T:  03/31/2005  Job:  [id number]

## 2010-08-04 NOTE — Op Note (Signed)
NAMEVINTON, [name]                  ACCOUNT NO.:  [id number]   MEDICAL RECORD NO.:  [id number]          PATIENT TYPE:  INP   LOCATION:  5023                         FACILITY:  [facility]   PHYSICIAN:  Nelda Severe, MD      DATE OF BIRTH:  August 27, 1945   DATE OF PROCEDURE:  03/26/2005  DATE OF DISCHARGE:                                 OPERATIVE REPORT   SURGEON:  Dr. Theadore Nan.   ASSISTANT:  Barry Dienes, R.N., PA-C.   PREOPERATIVE DIAGNOSIS:  Status post lumbar laminectomy, L4-5 disk  herniation left.   POSTOPERATIVE DIAGNOSIS:  Status post lumbar laminectomy, L4-5 disk  herniation left. Fractured left inferior articular process L4.   OPERATIVE PROCEDURE:  Re-exploration laminectomy left L4-5, and left L4-5  disk excision.   The patient was placed under general endotracheal anesthesia. A gram of of  vancomycin was infused preoperatively. He was positioned prone on the  Thomas frame. Care was taken to position the upper extremity so as to avoid  hyperflexion and hyperabduction of the shoulders and hyperflexion of the  elbows. The arms were padded from axilla to hands with foam. There is no  pressure on the cubital tunnels on either side. The hips and knees were  gently flexed and supported on pillows.   The lumbar area was clipped of hair. The skin was prepared with DuraPrep and  the operative field draped in rectangular fashion. Drapes were secured with  Ioban. The previously made lumbar incision had been marked with a marking  pen prior to preparation.   The previous incision was reincised and the incision was carried down to the  spinous process of what turned out to be L4 proximally. Cross-table review  graft was taken with a Kocher at the L4 level. We identified the posterior  aspect of the sacrum and first sacral spinous process. We then mobilized the  paraspinal scar with a large Cobb elevator to the left side. The L4-5  apophyseal joint was identified and a large  Taylor retractor placed  laterally.   Curets were then used to curet away scar and laminectomy membrane from the  trailing edge of L4 proximally and from the sacrum distally. As I curetted  scar away from the inferior articular process of L4, it became apparent that  it was very mobile and that there must have developed a pars  interarticularis fracture at that level, although this was not evident on  the CT scan. The scar was curetted away from around the inferior articular  process and it was then excised. Then we created a plane between the medial  edge of the superior articular process of L5 and the contents of spinal  canal. We also curetted away laminectomy membrane creating a plane between  the trailing edge of L4 and the spinal canal contents.  It was then possible  to use a 3 mm Kerrison rongeur to extend the lateral recess decompression by  excising more of the medial edge of the superior articular process and a  little more of the trailing edge of the L4 lamina  and area of the pars  interarticularis. I was then able to use a Penfield #4 to enter the spinal  canal and retract the L5 nerve root medially revealing a large disk  herniation, contained deep to the posterior longitudinal ligament but  outside the confines of the disk and the somewhat distal to the disk space.  The posterior longitudinal ligament was incised and a nerve hook then used  to begin to deliver multiple very small fragments of disk. Ultimately I used  the nerve hook to apply pressure superficial to the superior longitudinal  ligament to more or less squeeze and number fragments of the cavity in which  they were lying.   Epstein downpushing curets were then used to loosen more fragments of disk  and particularly to make sure that there were no fragments remaining  medially at the level of the annulus. The disk was further enucleated using  pituitary rongeurs.   Eventually it appeared that we had delivered  all available fragments and  removed any loose fragments from inside disk space.   At about this point, a winged insect about a third the size of a house fly  gray in color landed on one of my gloves.  In then discarded the instrument  that was in my hand as well as my outer set of gloves. The wound was  irrigated with 1 liter of antibiotic solution including irrigating the disk  space by irrigating fluid through it using a 14 gauge Angiocath tip. No  further fragments floated out. The canal was probed again and the L5 nerve  root appeared free from pressure.   Because the fact that the patient is approximately 2-3 months postop, the  surfaces of the wound were extremely oozy. I therefore placed a one-eighth  inch Hemovac drain in the depth in the deep layer of the wound prior to  closing the fascia with interrupted #1 Vicryl suture in figure-of-eight  fashion. The subcutaneous layer was closed using 2-0 Vicryl in interrupted  and continuous fashion. The skin was closed using continuous 3-0 undyed  Vicryl in subcuticular fashion. The skin edges were reinforced with Steri-  Strips strips and a Betadine ointment dressing applied. Prior to applying  the dressing the eighth inch Hemovac drain was sutured into place with 2-0  nylon in basket weave fashion. The dressing was then secured with OpSite.  The patient was removed from the table into his bed.  He awakened without  difficulty and was able to move both feet and ankles.   Blood loss was estimated at 150 mL.   Sponge, needle counts were correct. There were no intraoperative  complications.      Nelda Severe, MD  Electronically Signed     MT/MEDQ  D:  03/28/2005  T:  03/28/2005  Job:  [id number]

## 2010-08-04 NOTE — Discharge Summary (Signed)
[name], [name]                              ACCOUNT NO.:  [id number]   MEDICAL RECORD NO.:  [id number]                   PATIENT TYPE:  INP   LOCATION:  6529                                 FACILITY:  [facility]   PHYSICIAN:  Rollene Rotunda, M.D.                DATE OF BIRTH:  1945-07-29   DATE OF ADMISSION:  11/26/2002  DATE OF DISCHARGE:  11/28/2002                           DISCHARGE SUMMARY - REFERRING   DISCHARGE DIAGNOSES:  1. Coronary artery disease with patent grafts.  2. Hyperlipidemia, treated.  3. Diabetes mellitus, oral agents.  4. Hypertension.  5. Hyperlipidemia.  6. Gastroesophageal reflux disease.  7. History of diverticulitis with perforation, status post sigmoid colectomy     in 2002.   HOSPITAL COURSE:  [name] is a 65 year old male patient with a known  history of coronary artery disease.  He has previously had bypass surgery in  1999.  He presented to the emergency room on November 26, 2002 with  recurrent chest pain.  Lab studies during his stay include a hemoglobin of  12.9, hematocrit 38.2, white count 9.5, platelets 268.  Sodium 136,  potassium 3.6, BUN 15, creatinine 1.0.  Cardiac isoenzymes were negative.  His hemoglobin A1C incidentally was 13.2, despite being on Glucophage.  Total cholesterol 176, triglycerides 381, LDL 60, HDL 40.   EKG on presentation revealed normal sinus rhythm with a rate of 64, with no  acute ST-T wave changes.   The patient ultimately underwent cardiac catheterization the following day,  November 27, 2002, and was found to have a patent graft.  At his point, Dr.  Antoine Poche felt that the patient had native severe three vessel coronary  artery disease with patent grafts and well-preserved left ventricular  function.  At this point, we will continue risk reduction.   FOLLOW UP:  I have asked the patient to return to see Dr. Scotty Court for  optimizing diabetes control.  He will need to make this appointment.  He has  an  appointment set up with Dr. Huston Foley on December 22, 2002 at 10:45 a.m.  He is  to call for any questions or concerns.   ACTIVITY:  No lifting, driving for two days, and then gradually increase  activity.   DIET:  Remain on a low-salt, low-fat, low-cholesterol, and diabetic diet.   DISCHARGE INSTRUCTIONS:  1. She is to clean her catheterization site with soap and water.  2. She is to call for questions or concerns.   FINAL MEDICATIONS:  1. Toprol-XL 100 mg daily.  2. Zocor as prior to admission.  3.     Enteric-coated aspirin 325 mg daily.  4. Sublingual nitroglycerin p.r.n. chest pain.  5. He is to resume his Glucophage on November 30, 2002.      Guy Franco, P.A. [facility]  Rollene Rotunda, M.D.    LB/MEDQ  D:  11/28/2002  T:  11/28/2002  Job:  [id number]   cc:   Ellin Saba., M.D.  [address] [place]  [place] [address]  Fax: [phone]   Charlies Constable, M.D.

## 2010-08-04 NOTE — Op Note (Signed)
[place]. [facility]  Patient:    [name], [name] Visit Number: [id number] MRN: [id number]          Service Type: SUR Location: RCRM 2550 05 Attending Physician:  Sonda Primes Proc. Date: 12/16/00 Admit Date:  12/16/2000   CC:         Lindaann Slough, M.D.  Marnee Spring. Wiliam Ke, M.D.   Operative Report  PREOPERATIVE DIAGNOSIS:  Sigmoid diverticulitis and diverticulosis.  POSTOPERATIVE DIAGNOSIS:  Sigmoid diverticulitis and diverticulosis.  PROCEDURES: 1. Bilateral ureteral catheter insertions. 2. Sigmoid colectomy with anastomosis and mobilization of splenic flexure.  SURGEONS: 1. Lindaann Slough, M.D., urology. 2. Mardene Celeste. Lurene Shadow, M.D. 3. Marnee Spring. Wiliam Ke, M.D.  ANESTHESIA:  General.  INDICATIONS:  This patient is a 65 year old man with recurrent episodes of diverticulitis.  On his last episode, he was noted to have a mesentery abscess, which was drained percutaneously.  The patient continued to do well on antibiotics, but at some point continued to have some tenderness and pain in the left lower quadrant.  He was prepared by routine bowel preparation and brought to the operating room colectomy.  DESCRIPTION OF PROCEDURE:  Following the induction of satisfactory general anesthesia with the patient positioned in modified ______ position after ureteral catheters had been placed by Lindaann Slough, M.D., the abdomen and perineum were prepped and draped to be included in the sterile operative field.  Exploratory laparotomy was then carried out through a midline incision extending from the pubis up to the epigastrium.  Exploration of the abdomen was carried out.  He was noted that he had a large phlegmonic mass which involved some portions of the distal small intestine and the dome of the bladder, as well as the mesentery of the sigmoid colon and the sigmoid colon itself.  The remainder of the exploration was without any evidence of  other pathology.  Dissection was first started by dissecting the region of adherence of the bladder off of the bladder.  This was thoroughly inspected.  There was no evidence of a fistula into the bladder.  The small bowel that was adhered to the abscess was also dissected and a portion of the dissected bowel was imbricated with interrupted 3-0 silk sutures.  The sigmoid colon was then mobilized along the peritoneal reflection, dissecting down over the pelvic brim and superiorly along the retroperitoneal reflection up to the splenic flexure.  The splenic flexure and transverse colon were taken down so as to provide sufficient length for anastomosis.  The distal portion of the specimen was taken in soft pliable rectum just at the region of the sacral promontory and the proximal portion was taken at approximately the mid descending colon. The intervening mesentery was taken between clamps and secured with ties of 0 and 2-0 silk sutures.  Mobilization of the descending colon, splenic flexure, and transverse colon was sufficient so as to effect a tension-free anastomosis.  We used the EEA stapling device to effect this anastomosis.  The anastomosis was then tested with insufflation of the rectum and colon with air under water with no evidence of a leak.  There were no complete rings of tissue removed from the EEA stapling device indicative of a complete anastomosis.  The mesenteric defect in the pelvis was then closed with interrupted 3-0 silk sutures.  Sponge, instrument, and sharp counts were verified.  All areas were checked for hemostasis.  Additional bleeding points were strengthened with clamps and ties of 3-0 or 2-0 silk sutures.  The  abdominal cavity was thoroughly irrigated with multiple aliquots of normal saline.  A second sponge count was verified and the wound closed in layers as follows:  The midline was closed with a running suture of #1 Novofil.  The subcutaneous tissues were  thoroughly irrigated and the skin was closed with stainless steel staples.  Sterile dressings were applied.  The anesthetic was reversed.  The patient was moved from the operating room to the recovery room in stable condition.  He tolerated the procedure well. Attending Physician:  Sonda Primes DD:  12/16/00 TD:  12/16/00 Job: [id number]

## 2010-08-04 NOTE — Op Note (Signed)
   [name], [name]                            ACCOUNT NO.:  [id number]   MEDICAL RECORD NO.:  [id number]                   PATIENT TYPE:  OIB   LOCATION:  2899                                 FACILITY:  [facility]   PHYSICIAN:  Leonie Man, M.D.                DATE OF BIRTH:  06/25/1945   DATE OF PROCEDURE:  05/13/2002  DATE OF DISCHARGE:                                 OPERATIVE REPORT   PREOPERATIVE DIAGNOSIS:  Recurrent abdominal wall abscesses x2.   POSTOPERATIVE DIAGNOSIS:  Recurrent abdominal wall abscesses x2.   OPERATION/PROCEDURE:  Incision and drainage of abdominal wall abscesses with  culture.   ASSISTANT:  Nurse.   ANESTHESIA:  General.   INDICATIONS FOR PROCEDURE:  The patient is a 65 year old diabetic man status  post antibiotic therapy for a perforated diverticulitis who presents now  with abdominal wall pain which, on evaluation, turns out to be an abdominal  wall abscess.  Cultures have been showing that this patient has been growing  Candida and he comes to the operating room now after the percutaneous  drainage shows that the abscess has re-accumulated.   DESCRIPTION OF PROCEDURE:  Following the induction of satisfactory general  anesthesia, the patient was positioned supine and the abdomen is prepped and  draped including a sterile operative field.  There are two fluctuant areas  over the left rectus muscle, one more medially and one laterally.  The  larger more lateral one was incised down upon and immediately a large amount  of pus and bloody material was extracted.  This was cultured for aerobic and  anaerobic cultures and the wound was thoroughly irrigated and then packed  with 1 inch gauze packing.  Subsequently, the second abscess was a somewhat  smaller abscess which was more medial, was cut down upon and again, purulent  drainage was removed.  This was packed with a 1 inch gauze packing.  Hemostasis was assured with electrocautery.  Sponge and  instrument counts  were verified and the wounds dressed with sterile gauze dressings.  The  patient was removed from the operating room suite to the recovery room in  stable condition.  He tolerated the procedure well.                                                Leonie Man, M.D.    PB/MEDQ  D:  05/13/2002  T:  05/13/2002  Job:  [id number]

## 2010-08-04 NOTE — Op Note (Signed)
[place]. [facility]  Patient:    [name], [name] Visit Number: [id number] MRN: [id number]          Service Type: SUR Location: RCRM 2550 05 Attending Physician:  Sonda Primes Dictated by:   Lindaann Slough, M.D. Proc. Date: 12/16/00 Admit Date:  12/16/2000   CC:         Luisa Hart L. Lurene Shadow, M.D.   Operative Report  PREOPERATIVE DIAGNOSIS:  Sigmoid diverticulitis.  POSTOPERATIVE DIAGNOSIS:  Sigmoid diverticulitis.  PROCEDURE:  Urinary cystoscopy and insertion of bilateral ureteral catheters.  SURGEON:  Lindaann Slough, M.D.  ANESTHESIA:  General.  INDICATIONS:  The patient is a 65 year old male who is scheduled for a sigmoid colectomy.  The patient is scheduled for insertion of bilateral ureteral catheters at Dr. Levi Aland request.  DESCRIPTION OF PROCEDURE:  Under general anesthesia, the patient was prepped and draped and placed in the dorsal lithotomy position.  A #22 Wappler cystoscope was inserted into the bladder.  The urethra was normal.  There was no stone or tumor in the bladder.  The ureteral orifices were in normal position and shape with clear efflux.  A #6 [place] whistle-tip catheter was then passed through the cystoscope into the right ureter all the way up into the renal pelvis.  The same procedure was then done on the left side.  The cystoscope was then removed.  A #16 Foley catheter was then inserted into the bladder.  The ureteral catheters were then secured to the Foley catheter.  The patient was then left in the dorsal lithotomy position for the sigmoid colectomy by Dr. Lurene Shadow. Dictated by:   Lindaann Slough, M.D. Attending Physician:  Sonda Primes DD:  12/16/00 TD:  12/16/00 Job: [id number]

## 2010-08-04 NOTE — Op Note (Signed)
NAMEJAMERION, [name]                  ACCOUNT NO.:  [id number]   MEDICAL RECORD NO.:  [id number]          PATIENT TYPE:  INP   LOCATION:  2870                         FACILITY:  [facility]   PHYSICIAN:  Nelda Severe, MD      DATE OF BIRTH:  [birth year]   DATE OF PROCEDURE:  12/20/2004  DATE OF DISCHARGE:                                 OPERATIVE REPORT   SURGEON:  Nelda Severe, MD   ASSISTANT:  Odis Hollingshead, PA   PREOPERATIVE DIAGNOSIS:  L4-5 spinal stenosis, left L5-S1 disk herniation.   POSTOP DIAGNOSIS:  L4-5 spinal stenosis, left L5-S1 lateral recess stenosis.   INDICATIONS FOR SURGERY:  Chronic left sciatic pain.   OPERATIVE NOTE:  The patient was placed under general endotracheal  anesthesia. A Foley catheter was placed in the bladder. A gram of vancomycin  had been given intravenously. The patient was positioned prone on a Jackson  frame using the slings to support his lower extremities. The upper  extremities were carefully positioned to avoid hyperflexion and abduction of  the shoulders, hyperflexion the elbows and any pressure whatsoever on the  cubital tunnels. They were carefully padded from axilla to hands with foam.   Hair was clipped from the lumbar area. The lumbosacral area was prepped with  DuraPrep and draped in rectangular fashion. The drapes were secured with  Ioban.   A midline incision was made over what was first perceived to be L4-5 and L5-  S1. Subcutaneous tissue and paraspinal fascia were injected with a mixture  of 1/4% Marcaine with epinephrine and 1% Marcaine with epinephrine.  Dissection was carried down to the spinous processes. Paraspinal muscles  reflected bilaterally. A localizing radiograph was taken with a Kocher clamp  on what appeared to be the spinous process of L4; and this was checked by  the radiologist.   We then performed a left-sided L5 laminectomy and medial facetectomy at L5-  S1. The S1 nerve root was somewhat unusual insofar as  it originated very far  proximally, just a little distally and posterior to the origins of the L5  nerve root. There was no evidence of any disk herniation. The root did  appear to be somewhat compressed in the lateral recess at L5-S1.   We then performed a bilateral laminectomy at L4 with medial facetectomy  bilaterally. The L5 nerve root was exposed on the left side and decompressed  in its lateral recess and out into the neural foramen. It was quite tight in  the lateral recess. On the asymptomatic right side the L5 nerve root was  exposed from proximal to the pedicle of L5 and distally. As noted a medial  facetectomy was performed at L4-5 on the right side as well as the left.   There is a small amount of epidural oozing which was controlled with bipolar  coagulation (left side L5-S1) and with Gelfoam which was left in place for  approximately 10 minutes and then removed. The bony surfaces were smeared  with bone wax. Having performed these two maneuvers there was really no  bleeding  or oozing into the wound.   I then closed in layers using interrupted figure-of-eight #0 Vicryl in the  fascia, 2-0 inverted Vicryl sutures interrupted fashion in the subcutaneous  layer and continuous 3-0 undyed Vicryl in subcuticular fashion to close the  skin. The skin edges were reinforced with Steri-Strips and a nonadherent  antibiotic ointment dressing applied and secured with OpSite. The patient  was then transferred to his bed.   At the time of dictation he has not awakened sufficiently to perform a  neurologic examination but he was flexing both hips and knees as he was  turned over to his bed.   The sponge and needle counts were correct. There were no intraoperative  complications. Blood loss is estimated at 50-100 mL.      Nelda Severe, MD  Electronically Signed     MT/MEDQ  D:  12/20/2004  T:  12/20/2004  Job:  [id number]

## 2010-08-04 NOTE — Consult Note (Signed)
[name], [name]                  ACCOUNT NO.:  [id number]   MEDICAL RECORD NO.:  [id number]          PATIENT TYPE:  INP   LOCATION:  5023                         FACILITY:  [facility]   PHYSICIAN:  Sherin Quarry, MD      DATE OF BIRTH:  [birth year]   DATE OF CONSULTATION:  03/28/2005  DATE OF DISCHARGE:                                   CONSULTATION   HISTORY OF PRESENT ILLNESS:  [name]. Cho is a pleasant 65 year old man who  underwent L4-5 laminectomy on March 26, 2005.  This is his second  postoperative day.  [name] states that on Monday, one week prior to the  hospitalization, he presented to Dr. Charmian Muff office with complaints of  nasal congestion, frontal headache and cough productive of brownish-green  sputum.  Dr. Scotty Court felt that he perhaps had a sinusitis and placed him on  doxycycline 100 mg b.i.d. which he took for 7 days prior to the operative  procedure.  It was Mr. [name] impression that the cough gradually improved  on this regimen and became less productive.  Postoperatively, the patient  has had a persistent temperature, which was 99.4 yesterday and 101.1 today.  He has been placed on nebulizer treatments with albuterol and Atrovent,  which has helped a sense of chest tightness that he was experiencing last  night.  Overall, he says that the cough has improved.  His main complaints  at this point are that he is having some acid reflux, probably because of  being recumbent, and he is having persistent constipation.  He was able to  ambulate in the hall yesterday without too much difficulty.  He feels that  his back pain is greatly improved.   Mr. [name] has several other active medical problems.  1.  Coronary artery disease.  The patient is status post CABG in 1999.  In      2004, he was admitted with complaints of chest pain and underwent      cardiac catheterization at that time.  Grafts were found to be patent,      and left ventricular function was found to  be preserved.  Therefore, no      further interventions were made.  The patient states that in September      of this year he had a stress test performed, and was found to have a      good outcome, and no changes in his medications were made.  2.  Diabetes.  The patient states that he monitors his blood sugars twice      daily.  He says that he recently visited with Dr. Scotty Court about his      blood sugar results because he was concerned that the blood sugars had      been in the range of 180 to 230.  Dr. Scotty Court asked him to add a second      medication, which I believe is Avandia.  The patient is not completely      sure about this.  He has not been taking the Avandia long  enough to      really to tell what impact this will have.  He has not recently had any      problems with visual blurring, excessive thirst or urinary frequency.  3.  Hypertension.  This is said to be well regulated.  In the hospital, his      blood pressures have been in the range of 120-140/70-80.  4.  Gastroesophageal reflux disease.  The patient will sometimes have this      problem at home, but he does not take any medicines on a regular basis      for it.   PAST MEDICAL HISTORY:   MEDICATIONS:  The patient's normal medications are -  1.  Zocor 80 mg daily.  2.  Toprol-XL 100 mg daily.  3.  Glucophage 1 gm b.i.d.  4.  Aspirin 325 mg daily.  5.  Lyrica 150 b.i.d.  6.  He was also taking the doxycycline as previously described for 1 week      prior to admission.  7.  He is also taking another medicine for his diabetes, possibly Avandia.   ALLERGIES:  The patient is intolerant of MORPHINE, which has caused  gastrointestinal symptoms.   OPERATIONS:  1.  CABG in 1999.  2.  Sigmoid colectomy due to perforated diverticulitis.  3.  The patient has had multiple abdominal abscesses which have required      drainage over the period from 2002 to 2004.  4.  He also had a previous lumbar laminectomy in October of  2006 for L4-5      spinal stenosis, and states that he has had previous operations on his      left ankle and right wrist.   MEDICAL ILLNESSES:  See above.   FAMILY HISTORY:  Unknown.   SOCIAL HISTORY:  The patient discontinued cigarettes smoking in 1999, but  states that he continues to smoke cigars on an occasional basis.  He does  not use alcohol or drugs.  He lives with his wife and has 2 foster children.   REVIEW OF SYSTEMS:  HEAD:  He denies headache or dizziness.  EYES:  He  denies visual blurring or diplopia.  EARS/NOSE/THROAT:  He denies earache.  He says that the throbbing discomfort in his head is much improved.  He  denies sore throat.  CHEST:  See above.  As previously noted, the patient's  cough has improved.  It is much less productive than it was.  CARDIOVASCULAR:  He denies orthopnea, PND or ankle edema.  GI:  He complains  of chronic indigestion, which he attributes to recumbent position.  He  denies hematemesis or melena.  GU:  Denies dysuria or urinary frequency.  NEUROLOGIC:  No history of seizure or stroke.  ENDOCRINE:  Denies excessive  thirst, urinary frequency or nocturia.   PHYSICAL EXAMINATION:  GENERAL:  He is a very pleasant, cooperative  gentleman who appears to be in no acute distress.  VITAL SIGNS:  Blood pressure is 141/79, pulse is 100, respirations 18 and  regular.  O2 saturation is 93% on 2 liters.  His blood sugar this morning  was 188.  HEENT:  Within normal limits.  CHEST:  A few rhonchi at both bases.  There is mild expiratory wheezing.  CARDIOVASCULAR:  Normal S1 and S2.  The abdomen is slightly distended.  It  is nontender.  Bowel sounds are hypoactive.  There is no guarding or  rebound.  NEUROLOGIC:  Within normal limits.  EXTREMITIES:  No evidence of cyanosis or edema.   IMPRESSION:  1.  Probable bronchitis versus bronchopneumonia.  The patient's symptoms are     improving steadily.  A chest x-ray would be useful, although it may be       somewhat difficult to interpret, as it may reflect a previous finding.  2.  Acid reflux secondary to recumbent position.  3.  Constipation secondary to pain medications.  4.  Coronary artery disease status post coronary artery bypass graft.  5.  Hypertension.  6.  Diabetes.  7.  Dyslipidemia.  8.  History of multiple surgeries.   PLAN:  Will obtain a chest x-ray.  If this suggests the presence of a  bronchopneumonia, it might be wise to switch the patient to Avelox for  better coverage, although it would appear that the process is actually  spontaneously improving.  Will prescribe Zantac for the indigestion symptoms  and a stool softener.  His blood sugar control is  somewhat less than optimal, possibly he is not currently on Avandia, and I  will resume this.  I do not see any strong need to add insulin to his  regimen, as his blood sugars are in the range of 150-180.  Will follow up on  his chest x-ray.  Overall, the patient seems to be doing reasonably well.           ______________________________  Sherin Quarry, MD     SY/MEDQ  D:  03/28/2005  T:  03/28/2005  Job:  [id number]   cc:   Ellin Saba., M.D.  [address] [place]  [place] [address]   Charlies Constable, M.D. [facility]  [address]  [address]  [place]  [place] [address]   Nelda Severe, MD  Fax: [phone]

## 2010-08-04 NOTE — Op Note (Signed)
[name], [name]                  ACCOUNT NO.:  [id number]   MEDICAL RECORD NO.:  [id number]          PATIENT TYPE:  INP   LOCATION:  2014                         FACILITY:  [facility]   PHYSICIAN:  Nelda Severe, MD      DATE OF BIRTH:  Apr 19, 1945   DATE OF PROCEDURE:  03/30/2005  DATE OF DISCHARGE:                                 OPERATIVE REPORT   SURGEON:  Nelda Severe, MD.   ASSISTANT:  Odis Hollingshead, PA-C   PREOPERATIVE DIAGNOSIS:  Epidural hematoma/seroma, status post L4-5 revision  laminectomy and disk excision.   POSTOPERATIVE DIAGNOSIS:  L4-5 epidural hematoma (organized clot) status  post L4-5 revision laminectomy and disk excision.   OPERATIVE PROCEDURE:  Re-exploration laminectomy L4-5 and including  extension of laminectomy and removal of organized clot.   OPERATIVE NOTE:  The patient was placed under general endotracheal  anesthesia.  A gram of vancomycin had been infused intravenously. Bilateral  sequential compression devices were in place in the lower extremities. The  patient was positioned prone on a Jackson frame. The upper extremities were  positioned, carefully, so as to avoid hyperflexion and abduction of the  shoulders; and so as to avoid any pressure whatsoever on the cubital tunnels  as well as avoiding hyperflexion of the elbows. The upper extremities were  padded from axillae to hands using foam. The hips and knees were gently  flexed and supported on pillows.   The skin of the lumbar area was given a Betadine scrub and then preparation  carried out with Betadine. Square draping was carried out and the drapes  secured with Ioban.  The previous incision was reincised, sutures cut and  removed, and the wound was retracted. There was liquefied hematoma in the  subfascial area; but once I had sucked that hematoma down to the laminectomy  site, it was evident that the clot in the laminectomy was organized and  rubbery. This was then removed piecemeal  using Penfield elevators and a ball-  tipped nerve hook. Having done that, the dura was still quite tight  particularly laterally and proximally. Therefore, most of the remainder of  the superior articular process of L5 on the left was removed; and what  little more bone remained in the lateral recess at L5 overhanging medially  from the pedicle, was removed and the L5 nerve root identified and traced  proximally.   In order to develop an adequate plane between the dura and trailing edge of  the lamina of the L4 vertebra on the left, we removed the spinous processes  of L4 and developed a plane between the dura and the ligamentum flavum  centrally, and then worked laterally. The laminectomy was extended  proximally into L4 until we were medial to the L4 pedicle; and could trace  the L4 nerve root into the neural foramen at L4-5.   There was significant epidural bleeding and oozing. Approximately 40 minutes  were spent controlling this with a combination of bipolar coagulation,  temporary placement of Gelfoam, placement of FloSeal and Gelfoam. Ultimately  the bleeding was well-controlled.  However, to be on the safe side, a Blake  drain was inserted deep in the wound and brought out through a stab wound to  the left side. The drain was sutured in place with a 2-0 nylon basket-weave  type of suture. The lumbar fascia was closed using interrupted figure-of-  eight #1 Vicryl sutures. The subcutaneous tissue was closed using  interrupted 2-0 and #0 Vicryl sutures. The skin was closed using a  subcuticular 3-0 undyed Vicryl suture. The skin edges were reinforced with  Steri-Strips and a nonadherent antibiotic ointment dressing applied, and  secured with OpSite.   The patient was then placed in his bed, awakened, and extubated.  The  patient's heart rate intermittently increased into the 120s during the  procedure for no apparent reason. He does have a cardiac history; therefore,  we requested  that he be transferred to the step-down unit for continuous  monitoring, at least overnight.   There were no intraoperative complications. The sponge and needle counts  were correct. Estimated blood loss is 300 mL.      Nelda Severe, MD  Electronically Signed     MT/MEDQ  D:  03/30/2005  T:  03/31/2005  Job:  [id number]

## 2010-08-04 NOTE — Discharge Summary (Signed)
[name], [name]                  ACCOUNT NO.:  [id number]   MEDICAL RECORD NO.:  [id number]          PATIENT TYPE:  INP   LOCATION:  5033                         FACILITY:  [facility]   PHYSICIAN:  Nelda Severe, MD      DATE OF BIRTH:  [birth year]   DATE OF ADMISSION:  12/20/2004  DATE OF DISCHARGE:                                 DISCHARGE SUMMARY   HOSPITAL COURSE:  The man was admitted for management of chronic disabling  left sciatic pain.  He was taken the operating room on the day of admission  where bilateral L4-5 and unilateral L5-S1 laminectomy was carried out.  A  disk herniation was not identified.  He had lateral stenosis at both levels.  Postoperatively, his left leg pain appears to have satisfactorily resolved.  He has no complaints this morning.  There is good strength with dorsiflexion  and plantarflexion bilaterally.  The dressing is dry.   At the present time, he is being discharged.   MEDICATIONS ON DISCHARGE:  He is not being given any medication because he  has Percocet at home.  He will resume his other medications which he has  been taking.   FOLLOWUP:  He will be followed in the office in two week's time.  He will  call me if there are any problems.   ACTIVITY:  He has been told to not lift or bend.  I have cautioned him  against trying to be very active and should outings that last more than 1-2  hours at a time.   FINAL DIAGNOSIS:  Lumbar stenosis.   CONDITION ON DISCHARGE:  Stable, ambulatory.      Nelda Severe, MD  Electronically Signed     MT/MEDQ  D:  12/21/2004  T:  12/21/2004  Job:  [id number]

## 2010-08-04 NOTE — Op Note (Signed)
   NAMEGARVEY, WESTCOTT                              ACCOUNT NO.:  [id number]   MEDICAL RECORD NO.:  [id number]                   PATIENT TYPE:  AMB   LOCATION:  ENDO                                 FACILITY:  [facility]   PHYSICIAN:  Vania Rea. Jarold Motto, M.D. [facility]        DATE OF BIRTH:  [birth year]   DATE OF PROCEDURE:  DATE OF DISCHARGE:  01/12/2003                                 OPERATIVE REPORT   Esophageal manometry was completed on January 12, 2003, along with [facility]  testing.   RESULTS:  1. Upper esophageal sphincter - there appears to be good coordination     between pharyngeal contraction and cricopharyngeal relaxation.  2. Lower esophageal sphincter - there appears to be an incompetent lower     esophageal sphincter with mean pressure around 15 mmHg.  There appears to     be normal relaxation with swallowing.  3. Motility pattern - normally propagated peristaltic waves with normal     amplitude and duration through the length of the esophagus both wet and     dry swallows.  There is no evidence of esophageal motility disorder.  4. Robb Matar testing - this patient had a negative Bernstein test performed     with saline and 0.1 hydrochloric acid.   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  I see no evidence  of an esophageal motility disorder.                                               Vania Rea. Jarold Motto, M.D. [facility]    DRP/MEDQ  D:  01/28/2003  T:  01/28/2003  Job:  [id number]   cc:   Ulyess Mort, M.D. [facility]

## 2010-08-04 NOTE — Cardiovascular Report (Signed)
[name], [name]                              ACCOUNT NO.:  [id number]   MEDICAL RECORD NO.:  [id number]                   PATIENT TYPE:  INP   LOCATION:  6529                                 FACILITY:  [facility]   PHYSICIAN:  Rollene Rotunda, M.D.                DATE OF BIRTH:  13-Feb-1946   DATE OF PROCEDURE:  11/27/2002  DATE OF DISCHARGE:  11/28/2002                              CARDIAC CATHETERIZATION   PRIMARY CARE PHYSICIAN:  Tawny Asal, M.D.   REASON FOR PRESENTATION:  Evaluate patient with coronary disease, status  post coronary artery bypass graft.  He presents with chest pain (786.51).   PROCEDURE NOTE:  Left heart catheterization was performed via the right  femoral artery.  The artery was cannulated using an anterior wall puncture.  A 6 French arterial sheath was inserted via the modified Seldinger  technique.  Preformed Judkins and a pigtail catheter were utilized.  The  patient tolerated the procedure well and left the lab in stable condition.   RESULTS:   HEMODYNAMICS:  1. LV 115/19.  2. Aortic 115/87.   CORONARIES:  The left main was normal.  The LAD had proximal mid severe  diffuse disease.  The distal vessel was seen to fill via a LIMA.  There were  two small diagonals.  The circumflex was occluded in the mid AV groove after  first obtuse marginal.  The first obtuse marginal had a long 70% stenosis  into that took a severe angle.  It was a moderate size vessel.  The second  obtuse marginal was seen to fill via vein graft.  The right coronary artery  was dominant vessel.  It was occluded proximally.  There was also a total  occlusion after the PDA before posterior lateral.  The acute marginal and  PDA were seen to fill via sequential vein graft.  The posterior lateral was  seen to fill via sequential vein graft that was also sewn into the second  marginal.   GRAFTS:  The LIMA to the LAD was widely patent.  A saphenous vein graft  sequential to the  acute marginal and PDA was widely patent although there  was slow flow in an small acute marginal.  Saphenous vein graft to OM-2 and  posterior lateral from the right coronary artery was widely patent.   LEFT VENTRICULOGRAM:  Left ventriculogram was obtained in the RAO  projection.  The EF was 60% with no significant regional wall motion  abnormalities.   CONCLUSION:  1. Severe native three-vessel coronary artery disease.  2. Widely patent bypass grafts.  3. Well preserved ejection fraction.    PLAN:  The patient will continue to have secondary risk reduction.  He will  be evaluated for nonanginal causes of his chest pain.  Rollene Rotunda, M.D.    JH/MEDQ  D:  11/27/2002  T:  11/28/2002  Job:  [id number]   cc:   Ellin Saba., M.D.  [address] [place]  [place] [address]  Fax: [phone]

## 2010-08-04 NOTE — Discharge Summary (Signed)
[facility]  Patient:    [name], [name] Visit Number: [id number] MRN: [id number]          Service Type: SUR Location: [id number] Attending Physician:  Sonda Primes Dictated by:   Mardene Celeste. Lurene Shadow, M.D. Admit Date:  12/16/2000 Discharge Date: 12/24/2000   CC:         Luisa Hart L. Lurene Shadow, M.D. 2 copies   Discharge Summary  ADMISSION DIAGNOSES: 1. Acute diverticulitis with diverticular abscess. 2. Coronary artery disease, status post coronary artery bypass graft. 3. Diabetes mellitus type 2. 4. Hypertension.  DISCHARGE DIAGNOSES: 1. Acute diverticulitis with diverticular abscess. 2. Coronary artery disease, status post coronary artery bypass graft. 3. Diabetes mellitus type 2. 4. Hypertension.  PROCEDURES:  Pelvic CT scan with CT guided transcutaneous drainage of a diverticular abscess.  COMPLICATIONS:  None.  CONDITION ON DISCHARGE:  Improved.  DISCHARGE MEDICATIONS: 1. Augmentin 875 mg p.o. b.i.d. x 10 days. 2. Maxidone one or two q.4h. p.r.n. pain. 3. The patient is continued on his usual medications of Glucotrol for his    diabetes, and Toprol XL for his blood pressure.  HOSPITAL COURSE:  [name] is a pleasant 65 year old man who had been seen and treated previously for diverticular disease.  He was admitted on 8/20, with severe abdominal pain, maximizing to the left lower quadrant, radiation to the suprapubic region, to the right lower quadrant, no associated nausea or vomiting.  Had been passing flatus, and had been having some diarrhea.  He had no associated hematochezia, no bloody urine.  On evaluation, he had a leukocytosis of 11,900 with a slight left shift.  Further evaluation showed him to have a temperature of 100.5, pressure was otherwise stable.  Following admission, the patient was started on Unasyn 3 g IV q.6h.  On 8/21, he felt somewhat subjectively better, his temperature was down, and leukocytosis  had decreased.  Repeat CT scan of his abdomen showed some increase in the size of the abscess with enhanced rim.  Decided that he should undergo transcutaneous CT guided drainage of the abscess.  On 8/23, he underwent the scheduled procedure.  After the procedure he continued to do well, and was afebrile, he was somewhat anorexic, although he was not having any nausea or vomiting. Diabetes was well controlled on a sliding scale insulin protocol with CBGs running approximately under 120.  He was continued on his antibiotic protocol with drainage and irrigation of his drains.  On 8/28, repeat CT scan showed significant decrease in the size of the abscess.  At that point, it was decided that he should be discharged home, however, he was sent home with the drain in place, and is to be followed by home health nurse for drain care.  As noted above, he was also discharged on Augmentin 875 mg b.i.d. Dictated by:   Mardene Celeste. Lurene Shadow, M.D. Attending Physician:  Sonda Primes DD:  12/27/00 TD:  12/28/00 Job: [id number]

## 2010-08-04 NOTE — Discharge Summary (Signed)
[name], ARNS                  ACCOUNT NO.:  [id number]   MEDICAL RECORD NO.:  [id number]          PATIENT TYPE:  INP   LOCATION:  5028                         FACILITY:  [facility]   PHYSICIAN:  Nelda Severe, MD      DATE OF BIRTH:  02/22/1946   DATE OF ADMISSION:  03/26/2005  DATE OF DISCHARGE:  04/02/2005                                 DISCHARGE SUMMARY   This man was admitted for treatment of a disk herniation at L4-5 status post  laminectomy for spinal stenosis.  He had severe left sciatic pain.  Day of  admission he was taken to the operating room where a re-exploration  laminectomy was carried out and excision of a large disk fragment.  Postoperatively his course was complicated by the formation of an epidural  seroma/hematoma which required re-exploration laminectomy on the fourth  postoperative day, March 30, 2004 .  Prior to that he had had some  respiratory problems and was seen in consultation by internal medicine  service.  Subsequent to his re-exploration and evacuation of seroma he had  some persistent leg pain, although not as severe as prior to the re-  exploration.  Drain was left in situ to the second postoperative day.  By  January 15 his pain had improved significantly and he was able to be  discharged home for follow-up in the office.  He was discharged home with a  prescription for Percocet.  Instructed to avoid bending or lifting.  He was  instructed to call the office for an appointment to be seen in follow-up in  approximately 10 days' time.   FINAL DIAGNOSIS:  Status post laminectomy for __________ stenosis with L4-5  disk herniation, epidural hematoma complicating revision laminectomy.   CONDITION ON DISCHARGE:  Stable, with improved pain.   Follow up in the office.   DISCHARGE MEDICATIONS:  Percocet for analgesia.      Nelda Severe, MD  Electronically Signed     MT/MEDQ  D:  07/18/2005  T:  07/19/2005  Job:  [id number]

## 2010-08-04 NOTE — Discharge Summary (Signed)
[name], [name]                  ACCOUNT NO.:  [id number]   MEDICAL RECORD NO.:  [id number]          PATIENT TYPE:  INP   LOCATION:  5028                         FACILITY:  [facility]   PHYSICIAN:  Nelda Severe, MD      DATE OF BIRTH:  1945-10-10   DATE OF ADMISSION:  03/26/2005  DATE OF DISCHARGE:  04/02/2005                                 DISCHARGE SUMMARY   HISTORY OF PRESENT ILLNESS:  This man was admitted for management of  recurrent left sciatic pain status post laminectomy.  At the time of the  original laminectomy several months previously, he had findings of spinal  stenosis, but no disk herniation.  He had recurrent pain, and MRI scans  revealed a disk herniation.  On the day of admission, March 26, 2005, he  was taken to the operating room where re-exploration L4-5 laminectomy on the  left side was carried out.  A fractured inferior left facet was also  identified.  The fractured facet was removed.   Postoperatively, the patient had a fair amount of pain and also developed  problems with wheezing.  He was seen in consultation by the hospitalist.  It  was not felt that he had pneumonia.  At one point on postoperative day #3,  the pain in his leg was worse than it had been had been preoperatively.  He  was ultimately returned to the operating room on March 30, 2005 where we  performed a re-exploration laminectomy and evacuation of a organized  hematoma.  There were no recurrent disk fragments identified.  The wound was  drained with a Blake drain.  Postoperatively, the pain was less severe, but  did not completely abate.   Two days after the drainage of the epidural hematoma, his leg pain was  improving.  The [facility] hospitalist continued to see him for his bronchitis.  On April 02, 2005, 7 days after his original surgery, he was discharged in  stable condition.  He was given a prescription for Percocet and was asked to  return to the office in 9-10 days.  He was given the  usual instructions  against lifting and bending, and to call for fever or wound drainage.   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      Nelda Severe, MD  Electronically Signed     MT/MEDQ  D:  08/21/2005  T:  08/22/2005  Job:  [id number]

## 2010-08-04 NOTE — Discharge Summary (Signed)
   NAMESEVASTIAN, [name]                            ACCOUNT NO.:  [id number]   MEDICAL RECORD NO.:  [id number]                   PATIENT TYPE:  INP   LOCATION:  5707                                 FACILITY:  [facility]   PHYSICIAN:  Leonie Man, M.D.                DATE OF BIRTH:  03-Jul-1945   DATE OF ADMISSION:  12/17/2001  DATE OF DISCHARGE:  12/20/2001                                 DISCHARGE SUMMARY   ADMISSION DIAGNOSIS:  Perforated diverticulitis with abscess.   DISCHARGE DIAGNOSIS:  Perforated diverticulitis with abscess.   ADDITIONAL DIAGNOSES:  1. Status post coronary artery bypass graft x5.  2. Status post corneal transplant.  3. Status post right wrist injury repair.  4. Diabetes mellitus type 2.  5. Hypercholesterolemia.  6. Hypertension.   HISTORY OF PRESENT ILLNESS:  The patient is a 65 year old man who underwent  sigmoid colectomy approximately a year ago for a perforated diverticulitis.  He was admitted following a several-day history of low-grade temperatures  and right flank pain.  CT scans of the abdomen show a left pericolic abscess  measuring approximately 6 cm.  The patient did not have any nausea or  vomiting.  He has not been passing any flatus.  He had a mildly elevated  white blood cell count to 10.8.  Following admission to the hospital, he was  treated radiologically by percutaneous drainage of his abscess.  On day #2,  he was feeling significantly better, having started on Zosyn 3.375 mg q.6h.  Over the ensuing days, he had a maximal temperature elevation to 99.8 but  was otherwise doing well.  On day #3 of his hospitalization, he began  passing some flatus but did not have any bowel movements.  At that point,  his diet was advanced to a full-liquid diet.  His plan was for him to be  discharged home with the drains left in place and a repeat CT scan to be  done on Friday, October 10, to decide whether or not the drains could then  be removed.  On  October 4, he was discharged for followup in the office in  one week.   DISCHARGE MEDICATIONS:  Augmentin 875 mg b.i.d.   ACTIVITY:  As tolerated.   DIET:  Low residue.                                               Leonie Man, M.D.    PB/MEDQ  D:  01/30/2002  T:  01/30/2002  Job:  [id number]

## 2010-08-04 NOTE — H&P (Signed)
[name], [name]                              ACCOUNT NO.:  [id number]   MEDICAL RECORD NO.:  [id number]                   PATIENT TYPE:  INP   LOCATION:  6529                                 FACILITY:  [facility]   PHYSICIAN:  Salvadore Farber, M.D.             DATE OF BIRTH:  29-Nov-1945   DATE OF ADMISSION:  11/26/2002  DATE OF DISCHARGE:                                HISTORY & PHYSICAL   CARDIOLOGIST:  Charlies Constable, M.D.   PRIMARY CARE PHYSICIAN:  Tawny Asal, M.D.   CHIEF COMPLAINT:  Chest discomfort.   HISTORY OF PRESENT ILLNESS:  [name] is a 65 year old gentleman status post  coronary artery bypass grafting in 1999 who presents with multiple episodes  of chest discomfort. Over the past two weeks, he has had chest discomfort  and diaphoresis occurring reliably with exertion such as walking his dog  over the same time. He has also had several episodes of chest pain at rest,  most recently this morning. This morning while driving, he had the abrupt  onset of left sided chest discomfort radiating to his right shoulder  accompanied by substantial diaphoresis. This pain lasted for approximately  four minutes and resolved spontaneously. These discomforts are similar to  those he suffered prior to his bypass surgery, though then they were  accompanied by dyspnea. He denies orthopnea, PND, edema (except for mild  chronic right leg edema below his saphenous vein harvest site),  claudication, palpitations, syncope. He is currently pain free.   PAST MEDICAL HISTORY:  1. Coronary disease status post CABG 1999.  2. Hypertension.  3. Dyslipidemia.  4. Diabetes mellitus.  5. GERD.  6. Status post sigmoid colectomy due to perforated diverticulitis.  7. Multiple abdominal abscesses status post ICD several times over the past     two years. Most recent was in February 2004.   ALLERGIES:  No known drug allergies.   MEDICATIONS:  1. Zocor.  2. Toprol-XL 100 mg per day.  3.  Glucophage 1,000 mg b.i.d.  4. Aspirin 325 mg per day.   SOCIAL HISTORY:  The patient lives in [place] with his wife. His is a job  Paramedic. He has two foster children. Quit tobacco in 1999. Denies alcohol.   FAMILY HISTORY:  The patient is adopted.   REVIEW OF SYSTEMS:  Night sweats two to three times per week for several  months. Denies any change in weight. Denies fever. Negative in detail except  as above.   PHYSICAL EXAMINATION:  GENERAL:  A well appearing male in no distress with  heart 60, blood pressure 107/71, oxygen saturation of 97% on room air, and  temperature 98.1. He has no jugular venous distention.  LUNGS:  Clear to auscultation and percussion bilaterally.  HEART:  He has a nondisplaced point of maximal cardiac impulse. There is a  regular rate and rhythm without murmurs, rubs, or gallops.  ABDOMEN:  Soft, nontender, nondistended. There are multiple scars consistent  with his multiple prior surgeries. There is no hepatosplenomegaly. Bowel  sounds are normal.  EXTREMITIES:  Warm without clubbing, cyanosis, edema, or ulceration. Carotid  pulses are 2+ bilaterally without bruits. Femoral pulses are 2+ bilaterally  without bruits. DP and PT pulses are 2+ bilaterally.   STUDIES:  Electrocardiogram demonstrates normal sinus rhythm and has a  normal EKG.   Laboratory studies remarkable for hematocrit 38, platelets 268; creatinine  1.0, potassium 3.7; CK-MB 2.2, troponin less than 0.05 x2; INR 0.9; AST 29,  ALT 22, alkaline phosphatase 77.   IMPRESSION/RECOMMENDATIONS:  Patient with coronary disease status post  coronary artery bypass grafting in 1999. Presents with unstable angina  pectoris. Will treat him with aspirin, Plavix, heparin, beta blocker. Will  plan cardiac catheterization in the morning.   Will hold Glucophage in anticipation of his cardiac catheterization.  Continue statin for his dyslipidemia.                                                 Salvadore Farber, M.D.    WED/MEDQ  D:  11/26/2002  T:  11/27/2002  Job:  [id number]   cc:   Charlies Constable, M.D.   Ellin Saba., M.D.  [address] [place]  [place] [address]  Fax: [phone]

## 2010-08-04 NOTE — H&P (Signed)
[facility]  Patient:    [name], SODERMAN Visit Number: [id number] MRN: [id number]          Service Type: MED Location: 3E 0301 02 Attending Physician:  Charmaine Downs Dictated by:   Mike Gip, P.A.C. Adm. Date:  11/05/2000   CC:         Feliciana Rossetti, M.D.  Mardene Celeste Lurene Shadow, M.D.   History and Physical  CHIEF COMPLAINT:  Lower abdominal pain, progressive x 1 month.  HISTORY OF PRESENT ILLNESS:  Mr. Hartstein is a 65 year old white male, primary patient of Dr. Charlane Ferretti, who has history of coronary artery disease, status post CABG, in 1999, also with history of hypertension, hyperlipidemia, adult onset diabetes mellitus, and GERD.  He is also status post right corneal transplant.  He has a history of diverticulitis in April of 2001 which required hospitalization here at [facility] per Dr. Quintella Reichert.  This was documented on CT scan with sigmoid diverticulitis at that time and he was seen in consultation surgically by I believe Dr. Lurene Shadow.  He has had prior colonoscopy but not since 1991, at which time there was no evidence of diverticular disease; however, he did have some inflammatory changes in his sigmoid colon.  Biopsies were taken and were negative.  The patient says he has had intermittent episodes of lower abdominal pain associated with diarrhea over the past year which usually responds to bowel rest and improves within 2-3 days.  At this time he had onset about four weeks ago with lower abdominal pain which was initially not severe, but has persisted.  His pain has been worsening over the past two weeks, progressive now to the point where he has spent the past two weekends in bed, has had night sweats over the past one week, though no documented fevers during the day.  He says he is hurting continuously in the left right lower abdomen and this is worse with any movement, walking, etc. He has not noted any increased pain with bowel  movements, no rectal pain, no melena, or hematochezia.  He says his stools are loose with about one bowel movement per day.  He was seen in our office yesterday per Dr. Victorino Dike and felt to have acute diverticulitis and CT of the abdomen and pelvis was ordered urgently.  CT was done last evening here at [facility] and is consistent with a sigmoid diverticulitis with confined perforation/fluid collection/developing abscess centrally in the lower abdomen.  He is admitted at this time for IV antibiotics, bowel rest, and surgical consultation.  CURRENT MEDICATIONS: 1. Cipro 500 mg b.i.d., started November 04, 2000. 2. Toprol XL 100 q.d. 3. Glucophage 2000 mg q.h.s. 4. Zocor 80 q.d. 5. Soma p.r.n.  ALLERGIES:  No known drug allergies.  PAST HISTORY:  As outlined above.  FAMILY HISTORY:  The patient is adopted.  He does have a twin brother who he did grow up with, no history of colon cancer or inflammatory bowel disease.  SOCIAL HISTORY:  The patient is married.  He is employed with [employer].  He is an ex-smoker x 4 years, no ETOH.  REVIEW OF SYSTEMS:  CARDIOVASCULAR:  No current complaints of chest pain or anginal pain.  PULMONARY:  Negative for cough, shortness of breath, or sputum production.  GENITOURINARY:  No dysuria, urgency, or frequency.  ENDOCRINE: The patient has intentionally lost about 53 pounds over the past 1-1/2 years. GASTROINTESTINAL:  As above.  PHYSICAL EXAMINATION:  GENERAL:  Well-developed  white male in no acute distress.  He is alert and oriented x 3.  VITAL SIGNS:  Temperature 100.5, blood pressure 140/80, pulse is 97.  HEENT:  Atraumatic, normocephalic, EOMI, PERRLA, sclerae anicteric.  NECK:  Supple.  There is no JVD or bruit.  CARDIOVASCULAR:  Regular rate and rhythm with S1 and S2, no murmur, rub, or gallop.  PULMONARY:  Clear to A&P.  ABDOMEN:  Soft.  Bowel sounds are active.  He is rather diffusely tender with guarding and  rebound in the bilateral lower quadrants and in the suprapubic region.  There is no mass or hepatosplenomegaly noted.  RECTAL:  Not done at this time.  EXTREMITIES:  Without clubbing, cyanosis, or edema.  NEUROLOGIC:  Nonfocal.  LABORATORY:  November 04, 2000:  WBC 13.3, hemoglobin 13.6, hematocrit 40, MCV 85.  Glucose 171.  IMPRESSION: 78. A 65 year old white male with recurrent acute/subacute diverticulitis with    confined perforation and developing abscess, presumably secondary to    sigmoid diverticulitis. 2. Coronary artery disease, status post CABG. 3. Adult onset diabetes mellitus. 4. Status post right corneal transplant. 5. Hypertension. 6. Hyperlipidemia. 7. Gastroesophageal reflux disease.  PLAN:  The patient is admitted to the service of Dr. Claudette Head who is covering the hospital, for IV fluid hydration, bowel rest, IV Unasyn, pain control, and surgical consultation.  He will be placed on sliding scale insulin coverage while n.p.o.  If he is managed conservatively, will need repeat CT scan in a few days and eventual colonoscopy as an outpatient. Dictated by:   Mike Gip, P.A.C. Attending Physician:  Charmaine Downs DD:  11/05/00 TD:  11/05/00 Job: [id number]

## 2010-10-04 ENCOUNTER — Other Ambulatory Visit: Payer: Self-pay | Admitting: Endocrinology

## 2010-10-04 DIAGNOSIS — R42 Dizziness and giddiness: Secondary | ICD-10-CM

## 2010-10-04 DIAGNOSIS — R270 Ataxia, unspecified: Secondary | ICD-10-CM

## 2010-10-07 ENCOUNTER — Ambulatory Visit
Admission: RE | Admit: 2010-10-07 | Discharge: 2010-10-07 | Disposition: A | Discharge status: Home / Self Care | Payer: BC Managed Care – PPO | Source: Ambulatory Visit | Attending: Endocrinology | Admitting: Endocrinology

## 2010-10-07 DIAGNOSIS — R270 Ataxia, unspecified: Secondary | ICD-10-CM

## 2010-10-07 DIAGNOSIS — R42 Dizziness and giddiness: Secondary | ICD-10-CM

## 2010-10-07 MED ORDER — GADOBENATE DIMEGLUMINE 529 MG/ML IV SOLN
10.0000 mL | Freq: Once | INTRAVENOUS | Status: AC | PRN
  Administered 2010-10-07: 10 mL via INTRAVENOUS

## 2010-12-12 LAB — HEMOGLOBIN A1C
Hgb A1c MFr Bld: 11.6 — ABNORMAL HIGH
Mean Plasma Glucose: 336

## 2010-12-12 LAB — URINALYSIS, ROUTINE W REFLEX MICROSCOPIC
Bilirubin Urine: NEGATIVE
Glucose, UA: >1000 — AB
Ketones, ur: NEGATIVE
Protein, ur: 100 — AB
pH: 5.5

## 2010-12-12 LAB — CARDIAC PANEL(CRET KIN+CKTOT+MB+TROPI)
CK, MB: 1.8
CK, MB: 2.4
Total CK: 148
Troponin I: 0.02

## 2010-12-12 LAB — CK TOTAL AND CKMB (NOT AT ARMC): Total CK: 137

## 2010-12-12 LAB — CBC
HCT: 37.2 — ABNORMAL LOW
HCT: 42.8
Hemoglobin: 12 — ABNORMAL LOW
Hemoglobin: 12.3 — ABNORMAL LOW
MCHC: 32.8
MCHC: 33
MCV: 80.3
MCV: 80.3
Platelets: 150
Platelets: 153
RBC: 4.64
RDW: 18 — ABNORMAL HIGH
RDW: 18.1 — ABNORMAL HIGH
WBC: 7.5

## 2010-12-12 LAB — DIFFERENTIAL
Basophils Absolute: 0
Eosinophils Absolute: 0.1
Eosinophils Relative: 1
Lymphocytes Relative: 12
Lymphs Abs: 1.3
Neutrophils Relative %: 70

## 2010-12-12 LAB — URINE MICROSCOPIC-ADD ON

## 2010-12-12 LAB — COMPREHENSIVE METABOLIC PANEL
ALT: 19
AST: 20
CO2: 25
Calcium: 9.6
Chloride: 100
Creatinine, Ser: 1.29
GFR calc Af Amer: >60
GFR calc non Af Amer: 57 — ABNORMAL LOW
Glucose, Bld: 413 — ABNORMAL HIGH
Total Bilirubin: 0.6

## 2010-12-12 LAB — BASIC METABOLIC PANEL
BUN: 17
CO2: 26
Calcium: 8.6
Creatinine, Ser: 1.11
Glucose, Bld: 249 — ABNORMAL HIGH

## 2010-12-12 LAB — LIPID PANEL
LDL Cholesterol: 49
Total CHOL/HDL Ratio: 5.2
VLDL: 43 — ABNORMAL HIGH

## 2011-06-08 ENCOUNTER — Ambulatory Visit (INDEPENDENT_AMBULATORY_CARE_PROVIDER_SITE_OTHER): Payer: BC Managed Care – PPO | Admitting: Cardiology

## 2011-06-08 ENCOUNTER — Encounter: Payer: Self-pay | Admitting: Cardiology

## 2011-06-08 VITALS — BP 153/90 | HR 68 | Ht 70.0 in | Wt 198.0 lb

## 2011-06-08 DIAGNOSIS — Z01810 Encounter for preprocedural cardiovascular examination: Secondary | ICD-10-CM

## 2011-06-08 DIAGNOSIS — E785 Hyperlipidemia, unspecified: Secondary | ICD-10-CM

## 2011-06-08 DIAGNOSIS — I25810 Atherosclerosis of coronary artery bypass graft(s) without angina pectoris: Secondary | ICD-10-CM

## 2011-06-08 DIAGNOSIS — F17200 Nicotine dependence, unspecified, uncomplicated: Secondary | ICD-10-CM

## 2011-06-08 DIAGNOSIS — Z720 Tobacco use: Secondary | ICD-10-CM

## 2011-06-08 MED ORDER — VARENICLINE TARTRATE 0.5 MG X 11 & 1 MG X 42 PO MISC: ORAL | Status: AC

## 2011-06-08 NOTE — Assessment & Plan Note (Signed)
He does want to try Chantix.  We discussed all of the potential side effects and in particular I reviewed the [employer].  He has no depression.  He understands and wishes to try this medication.

## 2011-06-08 NOTE — Progress Notes (Signed)
HPI The patient presents for preoperative clearance prior to elbow surgery. He has a history of coronary artery disease as described below. He had bypass surgery in 1999. He has not seen a cardiologist in many years. He says he's had stress testing but doesn't recall when the last of these was. He does not report ever having chest pain with his diagnosis of coronary disease. He instead had fatigue. He does not get this symptom. However, he doesn't exercise routinely. He does say he walks at work without bring on the symptoms. He denies chest pressure, neck or arm discomfort. He does not have palpitations, presyncope or syncope. He unfortunately continues to smoke cigarettes.  Allergies  Allergen Reactions  . Morphine     Current Outpatient Prescriptions  Medication Sig Dispense Refill  . aspirin 81 MG tablet Take 81 mg by mouth daily.      Marland Kitchen exenatide (BYETTA) 10 MCG/0.04ML SOLN Inject 10 mcg into the skin 2 (two) times daily with a meal.      . ezetimibe (ZETIA) 10 MG tablet Take 10 mg by mouth daily.      . insulin glargine (LANTUS) 100 UNIT/ML injection Inject 100 Units into the skin at bedtime.      . insulin glulisine (APIDRA) 100 UNIT/ML injection Inject 100 Units into the skin daily.      . metoprolol (TOPROL-XL) 200 MG 24 hr tablet Take 200 mg by mouth daily.      . pioglitazone-metformin (ACTOPLUS MET) 15-850 MG per tablet Take 1 tablet by mouth 2 (two) times daily with a meal.      . pregabalin (LYRICA) 100 MG capsule Take 100 mg by mouth 2 (two) times daily.      . ramipril (ALTACE) 5 MG capsule Take 5 mg by mouth daily.      . rosuvastatin (CRESTOR) 20 MG tablet Take 20 mg by mouth daily.        No past medical history on file.  No past surgical history on file.  No family history on file.  History   Social History  . Marital Status: Married    Spouse Name: N/A    Number of Children: N/A  . Years of Education: N/A   Occupational History  . Not on file.   Social  History Main Topics  . Smoking status: Current Some Day Smoker  . Smokeless tobacco: Not on file  . Alcohol Use: Not on file  . Drug Use: Not on file  . Sexually Active: Not on file   Other Topics Concern  . Not on file   Social History Narrative  . No narrative on file    ROS:  As stated in the HPI and negative for all other systems.  PHYSICAL EXAM BP 153/90  Pulse 68  Ht 5\' 10"  (1.778 m)  Wt 198 lb (89.812 kg)  BMI 28.41 kg/m2 GENERAL:  Well appearing HEENT:  Pupils equal round and reactive, fundi not visualized, oral mucosa unremarkable NECK:  No jugular venous distention, waveform within normal limits, carotid upstroke brisk and symmetric, no bruits, no thyromegaly LYMPHATICS:  No cervical, inguinal adenopathy LUNGS:  Clear to auscultation bilaterally BACK:  No CVA tenderness CHEST:  Well healed sternotomy scar. HEART:  PMI not displaced or sustained,S1 and S2 within normal limits, no S3, no S4, no clicks, no rubs, no murmurs ABD:  Flat, positive bowel sounds normal in frequency in pitch, no bruits, no rebound, no guarding, no midline pulsatile mass, no hepatomegaly, no  splenomegaly, well healed surgical scar EXT:  2 plus pulses throughout, no edema, no cyanosis no clubbing SKIN:  No rashes no nodules NEURO:  Cranial nerves II through XII grossly intact, motor grossly intact throughout PSYCH:  Cognitively intact, oriented to person place and time  EKG:  Sinus rhythm, rate 74, axis within normal limits, intervals within normal limits, premature atrial contractions, but ventricular hypertrophy by voltage criteria, no acute ST-T wave changes. 06/08/2011  ASSESSMENT AND PLAN

## 2011-06-08 NOTE — Patient Instructions (Signed)
The current medical regimen is effective;  continue present plan and medications.  Your physician has requested that you have a lexiscan myoview. For further information please visit [email or web address]. Please follow instruction sheet, as given.   

## 2011-06-08 NOTE — Assessment & Plan Note (Signed)
The patient has known coronary disease. He is exercising routinely. He has ongoing high-risk features.  Given this, according to ACC/AHA guidelines stress testing is indicated. He would not be a little walk on a treadmill so he will have a [employer].

## 2011-06-08 NOTE — Assessment & Plan Note (Signed)
I will defer to Michiel Sites, MD with a goal LDL less than 70 and HDL greater than 40.

## 2011-06-11 ENCOUNTER — Ambulatory Visit (HOSPITAL_COMMUNITY): Payer: BC Managed Care – PPO | Attending: Internal Medicine | Admitting: Radiology

## 2011-06-11 VITALS — BP 131/72 | Ht 70.0 in | Wt 196.0 lb

## 2011-06-11 DIAGNOSIS — R0602 Shortness of breath: Secondary | ICD-10-CM | POA: Insufficient documentation

## 2011-06-11 DIAGNOSIS — R5381 Other malaise: Secondary | ICD-10-CM | POA: Insufficient documentation

## 2011-06-11 DIAGNOSIS — E785 Hyperlipidemia, unspecified: Secondary | ICD-10-CM | POA: Insufficient documentation

## 2011-06-11 DIAGNOSIS — Z794 Long term (current) use of insulin: Secondary | ICD-10-CM | POA: Insufficient documentation

## 2011-06-11 DIAGNOSIS — R5383 Other fatigue: Secondary | ICD-10-CM | POA: Insufficient documentation

## 2011-06-11 DIAGNOSIS — Z951 Presence of aortocoronary bypass graft: Secondary | ICD-10-CM | POA: Insufficient documentation

## 2011-06-11 DIAGNOSIS — I25810 Atherosclerosis of coronary artery bypass graft(s) without angina pectoris: Secondary | ICD-10-CM

## 2011-06-11 DIAGNOSIS — E119 Type 2 diabetes mellitus without complications: Secondary | ICD-10-CM | POA: Insufficient documentation

## 2011-06-11 DIAGNOSIS — I10 Essential (primary) hypertension: Secondary | ICD-10-CM | POA: Insufficient documentation

## 2011-06-11 DIAGNOSIS — R0989 Other specified symptoms and signs involving the circulatory and respiratory systems: Secondary | ICD-10-CM | POA: Insufficient documentation

## 2011-06-11 DIAGNOSIS — R0609 Other forms of dyspnea: Secondary | ICD-10-CM | POA: Insufficient documentation

## 2011-06-11 DIAGNOSIS — Z01810 Encounter for preprocedural cardiovascular examination: Secondary | ICD-10-CM | POA: Insufficient documentation

## 2011-06-11 DIAGNOSIS — F17200 Nicotine dependence, unspecified, uncomplicated: Secondary | ICD-10-CM | POA: Insufficient documentation

## 2011-06-11 DIAGNOSIS — I2510 Atherosclerotic heart disease of native coronary artery without angina pectoris: Secondary | ICD-10-CM

## 2011-06-11 MED ORDER — TECHNETIUM TC 99M TETROFOSMIN IV KIT
10.0000 | PACK | Freq: Once | INTRAVENOUS | Status: AC | PRN
  Administered 2011-06-11: 10 via INTRAVENOUS

## 2011-06-11 MED ORDER — TECHNETIUM TC 99M TETROFOSMIN IV KIT
30.0000 | PACK | Freq: Once | INTRAVENOUS | Status: AC | PRN
  Administered 2011-06-11: 30 via INTRAVENOUS

## 2011-06-11 MED ORDER — REGADENOSON 0.4 MG/5ML IV SOLN
0.4000 mg | Freq: Once | INTRAVENOUS | Status: AC
  Administered 2011-06-11: 0.4 mg via INTRAVENOUS

## 2011-06-11 NOTE — Progress Notes (Signed)
[facility] SITE 3 NUCLEAR MED [address] [place] [address] [phone]  Cardiology Nuclear Med Study  [name] is a 66 y.o. male     MRN : [id number]     DOB: 04-09-1945  Procedure Date: 06/11/2011  Nuclear Med Background Indication for Stress Test:  Evaluation for Ischemia, Graft Patency, and Surgical Clearance for  (L) arm  by Dr. Jodi Geralds,  History: '99 CABG: x3, 11/27/02 EF:60% patent grafts, 8/06 MPS: EF; 65% (-) ischemia Cardiac Risk Factors: Hypertension, IDDM Type 2, Lipids and Smoker  Symptoms:  DOE, Fatigue and SOB   Nuclear Pre-Procedure Caffeine/Decaff Intake:  6:00pm yesterday NPO After: 6:00pm yesterday   Lungs:  clear O2 Sat: 95% on room air. IV 0.9% NS with Angio Cath:  22g  IV Site: R Wrist x 1, tolerated well IV Started by:  Irean Hong, RN  Chest Size (in):  46 Cup Size: n/a  Height: 5\' 10"  (1.778 m)  Weight:  196 lb (88.905 kg)  BMI:  Body mass index is 28.12 kg/(m^2). Tech Comments:  No insulin or diabetic po medication today, held toprol x 24 hrs    Nuclear Med Study 1 or 2 day study: 1 day  Stress Test Type:  Eugenie Birks  Reading MD: Dietrich Pates, MD  Order Authorizing Provider:  Rollene Rotunda, MD  Resting Radionuclide: Technetium 50m Tetrofosmin  Resting Radionuclide Dose: 11.0 mCi   Stress Radionuclide:  Technetium 4m Tetrofosmin  Stress Radionuclide Dose: 33.0 mCi           Stress Protocol Rest HR: 54 Stress HR: 75  Rest BP: 131/72 Stress BP: 148/61  Exercise Time (min): n/a METS: n/a   Predicted Max HR: 157 bpm % Max HR: 47.77 bpm Rate Pressure Product: 41324   Dose of Adenosine (mg):  n/a Dose of Lexiscan: 0.4 mg  Dose of Atropine (mg): n/a Dose of Dobutamine: n/a mcg/kg/min (at max HR)  Stress Test Technologist: Milana Na, EMT-P  Nuclear Technologist:  Domenic Polite, CNMT     Rest Procedure:  Myocardial perfusion imaging was performed at rest 45 minutes following the intravenous administration of  Technetium 49m Tetrofosmin. Rest ECG: Sinus Bradycardia with rare pacs  Stress Procedure:  The patient received IV Lexiscan 0.4 mg over 15-seconds.  Technetium 35m Tetrofosmin injected at 30-seconds.  There were no significant changes, abdominal pain, sob, and rare pacs with Lexiscan.  Quantitative spect images were obtained after a 45 minute delay. Stress ECG: No significant change from baseline ECG  QPS Raw Data Images:  Images were motion corrected. Soft tissue (diaphragm, subcutaneous fat) surround heart. Stress Images:  Thinning with decreased counts in the inferoseptal wall (base, minimally mid)  Best seen in short axis.  Otherwise normal perfusion. Rest Images:  Minimal change from the stress images.  Only in short axis. Subtraction (SDS):  No significant ischemia by quantitation. Transient Ischemic Dilatation (Normal <1.22):  1.09 Lung/Heart Ratio (Normal <0.45):  0.39  Quantitative Gated Spect Images QGS EDV:  114 ml QGS ESV:  49 ml  Impression Exercise Capacity:  Lexiscan with low level exercise. BP Response:  Normal blood pressure response. Clinical Symptoms:  No significant symptoms noted. ECG Impression:  No significant ST segment change suggestive of ischemia. Comparison with Prior Nuclear Study:  No change from previous report.  Overall Impression:  Probable normal perfusion and soft tissue attenuation inferoseptally .   No evidence of signif ischemia or scar.  LV Ejection Fraction: 57%.  LV Wall Motion:  NL LV Function; NL Wall Motion  Dietrich Pates

## 2011-06-27 ENCOUNTER — Telehealth: Payer: Self-pay | Admitting: Cardiology

## 2011-06-27 NOTE — Telephone Encounter (Signed)
Faxed records to Sarah at [facility]: [phone]

## 2012-04-07 ENCOUNTER — Encounter: Payer: Self-pay | Admitting: Gastroenterology

## 2012-04-10 ENCOUNTER — Encounter: Payer: Self-pay | Admitting: Gastroenterology

## 2012-12-03 ENCOUNTER — Encounter: Payer: Self-pay | Admitting: Gastroenterology

## 2013-04-06 DIAGNOSIS — S02609A Fracture of mandible, unspecified, initial encounter for closed fracture: Secondary | ICD-10-CM

## 2013-04-06 HISTORY — DX: Fracture of mandible, unspecified, initial encounter for closed fracture: S02.609A

## 2013-04-08 ENCOUNTER — Encounter (HOSPITAL_COMMUNITY): Payer: Self-pay | Admitting: Emergency Medicine

## 2013-04-08 ENCOUNTER — Observation Stay (HOSPITAL_COMMUNITY): Payer: Medicare Other

## 2013-04-08 ENCOUNTER — Emergency Department (HOSPITAL_COMMUNITY): Payer: Medicare Other

## 2013-04-08 ENCOUNTER — Emergency Department (HOSPITAL_COMMUNITY)
Admission: EM | Admit: 2013-04-08 | Discharge: 2013-04-08 | Discharge status: Against Medical Advice | Payer: Medicare Other

## 2013-04-08 ENCOUNTER — Inpatient Hospital Stay (HOSPITAL_COMMUNITY)
Admission: EM | Admit: 2013-04-08 | Discharge: 2013-04-11 | DRG: 159 | Disposition: A | Discharge status: Home / Self Care | Payer: Medicare Other | Attending: Internal Medicine | Admitting: Internal Medicine

## 2013-04-08 ENCOUNTER — Other Ambulatory Visit: Payer: Self-pay | Admitting: Endocrinology

## 2013-04-08 ENCOUNTER — Ambulatory Visit
Admission: RE | Admit: 2013-04-08 | Discharge: 2013-04-08 | Disposition: A | Discharge status: Home / Self Care | Payer: Medicare Other | Source: Ambulatory Visit | Attending: Endocrinology | Admitting: Endocrinology

## 2013-04-08 DIAGNOSIS — M272 Inflammatory conditions of jaws: Principal | ICD-10-CM | POA: Diagnosis present

## 2013-04-08 DIAGNOSIS — L0390 Cellulitis, unspecified: Secondary | ICD-10-CM

## 2013-04-08 DIAGNOSIS — K219 Gastro-esophageal reflux disease without esophagitis: Secondary | ICD-10-CM

## 2013-04-08 DIAGNOSIS — E785 Hyperlipidemia, unspecified: Secondary | ICD-10-CM | POA: Diagnosis present

## 2013-04-08 DIAGNOSIS — I2510 Atherosclerotic heart disease of native coronary artery without angina pectoris: Secondary | ICD-10-CM | POA: Diagnosis present

## 2013-04-08 DIAGNOSIS — N390 Urinary tract infection, site not specified: Secondary | ICD-10-CM

## 2013-04-08 DIAGNOSIS — M26609 Unspecified temporomandibular joint disorder, unspecified side: Secondary | ICD-10-CM | POA: Diagnosis present

## 2013-04-08 DIAGNOSIS — Z947 Corneal transplant status: Secondary | ICD-10-CM

## 2013-04-08 DIAGNOSIS — Z7982 Long term (current) use of aspirin: Secondary | ICD-10-CM

## 2013-04-08 DIAGNOSIS — J209 Acute bronchitis, unspecified: Secondary | ICD-10-CM | POA: Diagnosis present

## 2013-04-08 DIAGNOSIS — Z951 Presence of aortocoronary bypass graft: Secondary | ICD-10-CM

## 2013-04-08 DIAGNOSIS — L0291 Cutaneous abscess, unspecified: Secondary | ICD-10-CM

## 2013-04-08 DIAGNOSIS — E119 Type 2 diabetes mellitus without complications: Secondary | ICD-10-CM | POA: Diagnosis present

## 2013-04-08 DIAGNOSIS — J159 Unspecified bacterial pneumonia: Secondary | ICD-10-CM

## 2013-04-08 DIAGNOSIS — F17200 Nicotine dependence, unspecified, uncomplicated: Secondary | ICD-10-CM | POA: Diagnosis present

## 2013-04-08 DIAGNOSIS — E1169 Type 2 diabetes mellitus with other specified complication: Secondary | ICD-10-CM | POA: Diagnosis present

## 2013-04-08 DIAGNOSIS — E1149 Type 2 diabetes mellitus with other diabetic neurological complication: Secondary | ICD-10-CM

## 2013-04-08 DIAGNOSIS — M545 Low back pain, unspecified: Secondary | ICD-10-CM | POA: Diagnosis present

## 2013-04-08 DIAGNOSIS — R609 Edema, unspecified: Secondary | ICD-10-CM

## 2013-04-08 DIAGNOSIS — Z8711 Personal history of peptic ulcer disease: Secondary | ICD-10-CM

## 2013-04-08 DIAGNOSIS — N410 Acute prostatitis: Secondary | ICD-10-CM

## 2013-04-08 DIAGNOSIS — I10 Essential (primary) hypertension: Secondary | ICD-10-CM | POA: Diagnosis present

## 2013-04-08 DIAGNOSIS — Z794 Long term (current) use of insulin: Secondary | ICD-10-CM

## 2013-04-08 DIAGNOSIS — R52 Pain, unspecified: Secondary | ICD-10-CM

## 2013-04-08 DIAGNOSIS — M26629 Arthralgia of temporomandibular joint, unspecified side: Secondary | ICD-10-CM | POA: Diagnosis present

## 2013-04-08 DIAGNOSIS — G8929 Other chronic pain: Secondary | ICD-10-CM | POA: Diagnosis present

## 2013-04-08 DIAGNOSIS — Z79899 Other long term (current) drug therapy: Secondary | ICD-10-CM

## 2013-04-08 DIAGNOSIS — Z720 Tobacco use: Secondary | ICD-10-CM | POA: Diagnosis present

## 2013-04-08 DIAGNOSIS — G4733 Obstructive sleep apnea (adult) (pediatric): Secondary | ICD-10-CM | POA: Diagnosis present

## 2013-04-08 DIAGNOSIS — Z885 Allergy status to narcotic agent status: Secondary | ICD-10-CM

## 2013-04-08 DIAGNOSIS — K5730 Diverticulosis of large intestine without perforation or abscess without bleeding: Secondary | ICD-10-CM

## 2013-04-08 HISTORY — DX: Pneumonia, unspecified organism: J18.9

## 2013-04-08 HISTORY — DX: Personal history of peptic ulcer disease: Z87.11

## 2013-04-08 HISTORY — DX: Dependence on other enabling machines and devices: Z99.89

## 2013-04-08 HISTORY — DX: Obstructive sleep apnea (adult) (pediatric): G47.33

## 2013-04-08 HISTORY — DX: Personal history of other diseases of the digestive system: Z87.19

## 2013-04-08 HISTORY — DX: Fracture of mandible, unspecified, initial encounter for closed fracture: S02.609A

## 2013-04-08 HISTORY — DX: Adverse effect of unspecified anesthetic, initial encounter: T41.45XA

## 2013-04-08 HISTORY — DX: Personal history of other medical treatment: Z92.89

## 2013-04-08 HISTORY — DX: Unspecified osteoarthritis, unspecified site: M19.90

## 2013-04-08 HISTORY — DX: Anemia, unspecified: D64.9

## 2013-04-08 HISTORY — DX: Other chronic pain: G89.29

## 2013-04-08 HISTORY — DX: Low back pain: M54.5

## 2013-04-08 HISTORY — DX: Type 2 diabetes mellitus without complications: E11.9

## 2013-04-08 HISTORY — DX: Gout, unspecified: M10.9

## 2013-04-08 HISTORY — DX: Low back pain, unspecified: M54.50

## 2013-04-08 HISTORY — DX: Other complications of anesthesia, initial encounter: T88.59XA

## 2013-04-08 LAB — BASIC METABOLIC PANEL
BUN: 17 mg/dL (ref 6–23)
CHLORIDE: 103 meq/L (ref 96–112)
CO2: 24 mEq/L (ref 19–32)
Calcium: 9.5 mg/dL (ref 8.4–10.5)
Creatinine, Ser: 1.23 mg/dL (ref 0.50–1.35)
GFR calc non Af Amer: 60 mL/min — ABNORMAL LOW (ref >90)
GFR, EST AFRICAN AMERICAN: 69 mL/min — AB (ref >90)
Glucose, Bld: 127 mg/dL — ABNORMAL HIGH (ref 70–99)
POTASSIUM: 5.3 meq/L (ref 3.7–5.3)
Sodium: 143 mEq/L (ref 137–147)

## 2013-04-08 LAB — CBC WITH DIFFERENTIAL/PLATELET
BASOS ABS: 0 10*3/uL (ref 0.0–0.1)
Basophils Relative: 0 % (ref 0–1)
Eosinophils Absolute: 0 10*3/uL (ref 0.0–0.7)
Eosinophils Relative: 0 % (ref 0–5)
HCT: 38.2 % — ABNORMAL LOW (ref 39.0–52.0)
HEMOGLOBIN: 12.4 g/dL — AB (ref 13.0–17.0)
LYMPHS PCT: 9 % — AB (ref 12–46)
Lymphs Abs: 1.3 10*3/uL (ref 0.7–4.0)
MCH: 29.3 pg (ref 26.0–34.0)
MCHC: 32.5 g/dL (ref 30.0–36.0)
MCV: 90.3 fL (ref 78.0–100.0)
Monocytes Absolute: 1.4 10*3/uL — ABNORMAL HIGH (ref 0.1–1.0)
Monocytes Relative: 10 % (ref 3–12)
NEUTROS ABS: 11.7 10*3/uL — AB (ref 1.7–7.7)
NEUTROS PCT: 81 % — AB (ref 43–77)
Platelets: 334 10*3/uL (ref 150–400)
RBC: 4.23 MIL/uL (ref 4.22–5.81)
RDW: 15.4 % (ref 11.5–15.5)
WBC: 14.3 10*3/uL — AB (ref 4.0–10.5)

## 2013-04-08 LAB — GLUCOSE, CAPILLARY: Glucose-Capillary: 142 mg/dL — ABNORMAL HIGH (ref 70–99)

## 2013-04-08 MED ORDER — ASPIRIN EC 81 MG PO TBEC
81.0000 mg | DELAYED_RELEASE_TABLET | Freq: Every day | ORAL | Status: DC
  Administered 2013-04-09 – 2013-04-11 (×3): 81 mg via ORAL
  Filled 2013-04-08 (×3): qty 1

## 2013-04-08 MED ORDER — RAMIPRIL 5 MG PO CAPS
5.0000 mg | ORAL_CAPSULE | Freq: Every day | ORAL | Status: DC
  Administered 2013-04-08: 5 mg via ORAL
  Filled 2013-04-08 (×2): qty 1

## 2013-04-08 MED ORDER — HYDROMORPHONE HCL PF 1 MG/ML IJ SOLN
1.0000 mg | INTRAMUSCULAR | Status: AC | PRN
  Administered 2013-04-08 (×2): 1 mg via INTRAVENOUS
  Filled 2013-04-08 (×2): qty 1

## 2013-04-08 MED ORDER — VANCOMYCIN HCL IN DEXTROSE 1-5 GM/200ML-% IV SOLN
1000.0000 mg | Freq: Once | INTRAVENOUS | Status: AC
  Administered 2013-04-08: 1000 mg via INTRAVENOUS
  Filled 2013-04-08: qty 200

## 2013-04-08 MED ORDER — ONDANSETRON HCL 4 MG PO TABS: 4.0000 mg | ORAL_TABLET | Freq: Four times a day (QID) | ORAL | Status: DC | PRN

## 2013-04-08 MED ORDER — ONDANSETRON HCL 4 MG/2ML IJ SOLN
4.0000 mg | Freq: Four times a day (QID) | INTRAMUSCULAR | Status: DC | PRN
  Administered 2013-04-08: 4 mg via INTRAVENOUS
  Filled 2013-04-08 (×2): qty 2

## 2013-04-08 MED ORDER — ACETAMINOPHEN 325 MG PO TABS
650.0000 mg | ORAL_TABLET | Freq: Four times a day (QID) | ORAL | Status: DC | PRN
Start: 2013-04-08 — End: 2013-04-11
  Filled 2013-04-08: qty 2

## 2013-04-08 MED ORDER — PIOGLITAZONE HCL-METFORMIN HCL 15-850 MG PO TABS: 1.0000 | ORAL_TABLET | Freq: Two times a day (BID) | ORAL | Status: DC

## 2013-04-08 MED ORDER — ENOXAPARIN SODIUM 40 MG/0.4ML ~~LOC~~ SOLN
40.0000 mg | SUBCUTANEOUS | Status: DC
  Administered 2013-04-08 – 2013-04-10 (×3): 40 mg via SUBCUTANEOUS
  Filled 2013-04-08 (×4): qty 0.4

## 2013-04-08 MED ORDER — PREGABALIN 50 MG PO CAPS
100.0000 mg | ORAL_CAPSULE | Freq: Two times a day (BID) | ORAL | Status: DC
  Administered 2013-04-08 – 2013-04-11 (×6): 100 mg via ORAL
  Filled 2013-04-08 (×6): qty 2

## 2013-04-08 MED ORDER — BIOTENE DRY MOUTH MT LIQD
15.0000 mL | Freq: Two times a day (BID) | OROMUCOSAL | Status: DC
  Administered 2013-04-09 – 2013-04-11 (×4): 15 mL via OROMUCOSAL

## 2013-04-08 MED ORDER — METFORMIN HCL 850 MG PO TABS
850.0000 mg | ORAL_TABLET | Freq: Two times a day (BID) | ORAL | Status: DC
  Administered 2013-04-09 – 2013-04-11 (×4): 850 mg via ORAL
  Filled 2013-04-08 (×7): qty 1

## 2013-04-08 MED ORDER — ACETAMINOPHEN 650 MG RE SUPP: 650.0000 mg | Freq: Four times a day (QID) | RECTAL | Status: DC | PRN

## 2013-04-08 MED ORDER — PIPERACILLIN-TAZOBACTAM 3.375 G IVPB 30 MIN
3.3750 g | Freq: Once | INTRAVENOUS | Status: AC
  Administered 2013-04-08: 3.375 g via INTRAVENOUS
  Filled 2013-04-08: qty 50

## 2013-04-08 MED ORDER — INSULIN GLARGINE 100 UNIT/ML ~~LOC~~ SOLN
25.0000 [IU] | Freq: Every day | SUBCUTANEOUS | Status: DC
  Administered 2013-04-08 – 2013-04-10 (×3): 25 [IU] via SUBCUTANEOUS
  Filled 2013-04-08 (×4): qty 0.25

## 2013-04-08 MED ORDER — MORPHINE SULFATE 4 MG/ML IJ SOLN
4.0000 mg | INTRAMUSCULAR | Status: AC | PRN
  Administered 2013-04-08 – 2013-04-09 (×3): 4 mg via INTRAVENOUS
  Filled 2013-04-08 (×3): qty 1

## 2013-04-08 MED ORDER — ASPIRIN 81 MG PO TABS: 81.0000 mg | ORAL_TABLET | Freq: Every day | ORAL | Status: DC

## 2013-04-08 MED ORDER — ONDANSETRON HCL 4 MG/2ML IJ SOLN
4.0000 mg | Freq: Once | INTRAMUSCULAR | Status: AC
  Administered 2013-04-08: 4 mg via INTRAVENOUS
  Filled 2013-04-08: qty 2

## 2013-04-08 MED ORDER — EZETIMIBE 10 MG PO TABS
10.0000 mg | ORAL_TABLET | Freq: Every day | ORAL | Status: DC
  Administered 2013-04-08 – 2013-04-11 (×4): 10 mg via ORAL
  Filled 2013-04-08 (×4): qty 1

## 2013-04-08 MED ORDER — ATORVASTATIN CALCIUM 20 MG PO TABS
20.0000 mg | ORAL_TABLET | Freq: Every day | ORAL | Status: DC
  Administered 2013-04-09 – 2013-04-10 (×2): 20 mg via ORAL
  Filled 2013-04-08 (×3): qty 1

## 2013-04-08 MED ORDER — INSULIN ASPART 100 UNIT/ML ~~LOC~~ SOLN
0.0000 [IU] | Freq: Three times a day (TID) | SUBCUTANEOUS | Status: DC
  Administered 2013-04-09: 2 [IU] via SUBCUTANEOUS
  Administered 2013-04-10 (×2): 1 [IU] via SUBCUTANEOUS

## 2013-04-08 MED ORDER — PIPERACILLIN-TAZOBACTAM 3.375 G IVPB
3.3750 g | Freq: Three times a day (TID) | INTRAVENOUS | Status: DC
  Administered 2013-04-09 – 2013-04-11 (×7): 3.375 g via INTRAVENOUS
  Filled 2013-04-08 (×12): qty 50

## 2013-04-08 MED ORDER — IOHEXOL 300 MG/ML  SOLN
50.0000 mL | Freq: Once | INTRAMUSCULAR | Status: AC | PRN
  Administered 2013-04-08: 50 mL via INTRAVENOUS

## 2013-04-08 MED ORDER — VANCOMYCIN HCL IN DEXTROSE 1-5 GM/200ML-% IV SOLN
1000.0000 mg | Freq: Two times a day (BID) | INTRAVENOUS | Status: DC
  Administered 2013-04-09 – 2013-04-11 (×5): 1000 mg via INTRAVENOUS
  Filled 2013-04-08 (×8): qty 200

## 2013-04-08 MED ORDER — PIOGLITAZONE HCL 15 MG PO TABS
15.0000 mg | ORAL_TABLET | Freq: Two times a day (BID) | ORAL | Status: DC
  Administered 2013-04-09 – 2013-04-11 (×4): 15 mg via ORAL
  Filled 2013-04-08 (×7): qty 1

## 2013-04-08 NOTE — ED Provider Notes (Signed)
CSN: [id number]     Arrival date & time 04/08/13  1601 History   First MD Initiated Contact with Patient 04/08/13 1643     Chief Complaint  Patient presents with  . jaw abscess     HPI  Patient presents with the complaint of left jaw pain.  History diabetes. He is in his normal state of health yesterday. Last evening when he went to eat dinner he notices left jaw was "sore". Time he went to bed he was having quite a bit of pain. He wears BiPAP at night for obstructive sleep apnea. He had a restless night and by 4 AM as having severe pain. Unable to sleep. He was unable to eat to rest today and was able to drink a small amount this morning. He had a pre-existing appointment with his primary care physician.  This is a shorter in x-ray, and ultimately a CT scan. CT shows fluid in the left TMJ and he was referred here. He states when he talks like his jaws push left to right, and he has to hold it in position.   It is exquisitely painful.  Past Medical History  Diagnosis Date  . CAD (coronary artery disease)   . Hypertension   . Diabetes mellitus    Past Surgical History  Procedure Laterality Date  . Coronary artery bypass graft      LIMA LAD, SVG OM1/PLA, SVG AM/PDA, 1999  . Corneal transplant    . Back surgery      Lumbar  . Hemicolectomy      Diverticulitis  . Elbow surgery      Left  . Wrist surgery      Right   Family History  Problem Relation Age of Onset  . Adopted: Yes   History  Substance Use Topics  . Smoking status: Current Some Day Smoker -- 0.50 packs/day    Types: Cigarettes  . Smokeless tobacco: Not on file     Comment: He smokes on most days.  . Alcohol Use: No    Review of Systems  Constitutional: Negative for fever, chills, diaphoresis, appetite change and fatigue.  HENT: Negative for mouth sores, sore throat and trouble swallowing.        Jaw pain with deviation. Severe left jaw pain. Painful speech. Soft tissue swelling and tenderness over the left TMJ.   Eyes: Negative for visual disturbance.  Respiratory: Negative for cough, chest tightness, shortness of breath and wheezing.   Cardiovascular: Negative for chest pain.  Gastrointestinal: Negative for nausea, vomiting, abdominal pain, diarrhea and abdominal distention.  Endocrine: Negative for polydipsia, polyphagia and polyuria.  Genitourinary: Negative for dysuria, frequency and hematuria.  Musculoskeletal: Negative for gait problem.  Skin: Negative for color change, pallor and rash.  Neurological: Negative for dizziness, syncope, light-headedness and headaches.  Hematological: Does not bruise/bleed easily.  Psychiatric/Behavioral: Negative for behavioral problems and confusion.    Allergies  Morphine  Home Medications   Current Outpatient Rx  Name  Route  Sig  Dispense  Refill  . aspirin 81 MG tablet   Oral   Take 81 mg by mouth daily.         Marland Kitchen exenatide (BYETTA) 10 MCG/0.04ML SOLN   Subcutaneous   Inject 10 mcg into the skin daily.          Marland Kitchen ezetimibe (ZETIA) 10 MG tablet   Oral   Take 10 mg by mouth daily.         . insulin glargine (LANTUS)  100 UNIT/ML injection   Subcutaneous   Inject 25 Units into the skin at bedtime.          . insulin glulisine (APIDRA) 100 UNIT/ML injection   Subcutaneous   Inject 20 Units into the skin daily.          . pioglitazone-metformin (ACTOPLUS MET) 15-850 MG per tablet   Oral   Take 1 tablet by mouth 2 (two) times daily with a meal.         . pregabalin (LYRICA) 100 MG capsule   Oral   Take 100 mg by mouth 2 (two) times daily.         . ramipril (ALTACE) 5 MG capsule   Oral   Take 5 mg by mouth daily.         . rosuvastatin (CRESTOR) 20 MG tablet   Oral   Take 20 mg by mouth daily.          BP 155/73  Pulse 101  Temp(Src) 99.3 F (37.4 C) (Oral)  Resp 17  Ht _0  (1.727 m)  Wt 209 lb (94.802 kg)  BMI 31.79 kg/m2  SpO2 93% Physical Exam  Constitutional: He is oriented to person, place, and  time. He appears well-developed and well-nourished. No distress.  HENT:  Head: Normocephalic.  He is edentulous to his left maxilla. No intraoral abnormalities. Marked trismus. When he speaks his jaw does deviate left or right. Induration and tenderness just anterior and overlying the TMJ. No pain on bimanual exam to the parotid. Trachea midline. No brawny induration to the neck.  Eyes: Conjunctivae are normal. Pupils are equal, round, and reactive to light. No scleral icterus.  Neck: Normal range of motion. Neck supple. No thyromegaly present.  Cardiovascular: Normal rate and regular rhythm.  Exam reveals no gallop and no friction rub.   No murmur heard. Pulmonary/Chest: Effort normal and breath sounds normal. No respiratory distress. He has no wheezes. He has no rales.  Abdominal: Soft. Bowel sounds are normal. He exhibits no distension. There is no tenderness. There is no rebound.  Musculoskeletal: Normal range of motion.  Neurological: He is alert and oriented to person, place, and time.  Skin: Skin is warm and dry. No rash noted.  Psychiatric: He has a normal mood and affect. His behavior is normal.    ED Course  Procedures (including critical care time) Labs Review Labs Reviewed  CBC WITH DIFFERENTIAL - Abnormal; Notable for the following:    WBC 14.3 (*)    Hemoglobin 12.4 (*)    HCT 38.2 (*)    Neutrophils Relative % 81 (*)    Neutro Abs 11.7 (*)    Lymphocytes Relative 9 (*)    Monocytes Absolute 1.4 (*)    All other components within normal limits  BASIC METABOLIC PANEL - Abnormal; Notable for the following:    Glucose, Bld 127 (*)    GFR calc non Af Amer 60 (*)    GFR calc Af Amer 69 (*)    All other components within normal limits  CULTURE, BLOOD (ROUTINE X 2)  CULTURE, BLOOD (ROUTINE X 2)   Imaging Review Ct Maxillofacial W/cm  04/08/2013   CLINICAL DATA:  Left facial swelling.  Rule out abscess.  EXAM: CT MAXILLOFACIAL WITH CONTRAST  TECHNIQUE: Multidetector CT  imaging of the maxillofacial structures was performed with intravenous contrast. Multiplanar CT image reconstructions were also generated. A small metallic BB was placed on the right temple in order to reliably differentiate  right from left.  CONTRAST:  27m OMNIPAQUE IOHEXOL 300 MG/ML  SOLN  COMPARISON:  X-rays from earlier today  FINDINGS: Vitamin-E capsule marks an area of swelling overlying the left parotid gland. The left parotid gland is normal. No evidence of abscess or mass in the left parotid gland.  The left TMJ is abnormal. There is a joint effusion. There is a small bony fragment anterior to the condyle which could be a fracture fragment. The joint effusion could be due to the fracture. This also some mild erosive changes in the cortex and septic arthritis is not excluded. The joint space is widened with anterior subluxation of the left TMJ compared to the right TMJ which appears normal.  The right parotid gland is normal. Submandibular gland is normal bilaterally. No mass or adenopathy. Visualized paranasal sinuses are clear.  IMPRESSION: Left parotid gland appears normal  Abnormal left TMJ with subluxation of the condyle. There is a joint effusion. There is a small bony fragment in the joint which may be due to acute fracture. Cannot exclude septic arthritis if there are clinical signs of infection.   Electronically Signed   By: CFranchot GalloM.D.   On: 04/08/2013 15:04   Dg Chest Port 1 View  04/08/2013   CLINICAL DATA:  Preop.  EXAM: PORTABLE CHEST - 1 VIEW  COMPARISON:  04/08/2013 at 12:31 p.m.  FINDINGS: Changes from CABG surgery are stable. The cardiac silhouette is mildly enlarged. Normal mediastinal and hilar contours. There is central vascular congestion and mild interstitial thickening. Additional lung base opacity is noted that is likely due to atelectasis. Allowing for differences in technique, there has been no significant change from the prior study. No pleural effusion or  pneumothorax.  IMPRESSION: No convincing acute cardiopulmonary disease. Stable appearance from the earlier study.   Electronically Signed   By: DLajean ManesM.D.   On: 04/08/2013 18:44    EKG Interpretation   None       MDM   1. Abscess   2. DM (diabetes mellitus)   3. HTN (hypertension)    CT scan does show obvious joint effusion with a bony fragment anterior to the joint within the synovium. No soft tissue abscess. I discussed the case with Dr. DBuelah Manis of oral surgery. He is en route and is arrived to evaluate the patient. Per his request I placed a call to radiology. Discussed case with Dr. HBethena Roys  Patient will go for CT guided aspiration as TMJ with radiology. Discussed case with the hospitalist. Patient be admitted for management as an infection in his diabetes. Diagnosis as TMJ abscess possible septic joint left TMJ.    MTanna Furry MD 04/08/13 [phone]

## 2013-04-08 NOTE — ED Notes (Signed)
The pt has had drawn already today

## 2013-04-08 NOTE — ED Notes (Signed)
Pt is shaking. Checked finger probe and Initiated 2L [place].

## 2013-04-08 NOTE — Procedures (Signed)
L TMJ aspiration 1 cc clear fluid No comp

## 2013-04-08 NOTE — ED Notes (Signed)
MD at bedside. Oral Surgeon

## 2013-04-08 NOTE — Consult Note (Signed)
Reason for Consult: Left Facial Swelling Referring Physician: Dr. Donnetta Hutching is an 68 y.o. male.  HPI: [name] is a 68 year old man that reports that yesterday he started to have left TMJ pain yesterday after working in his barn at home.  He reports that he got "bumped' around a little and that he thinks he might have gotten hit in the head with a piece of wood.  He doesn't believe that it hit him in the left joint, but he's not completely sure.  He noticed that he started swelling today and he had an appointment with his primary doctor (Dr. Daiva Huge). The patient reports that he have an IM injection of an antibiotic at his primary doctor.   He sent him for an outpatient Max Face CT scan, which showed a joint effusion with subluxation of the left TMJ.  There is also a small fragment of bone present in the joint.   The patient reports no previous problems with his joint or occlusion.  He reports no history of trauma to the joint (prior to working in his barn).   PMHx:  Past Medical History  Diagnosis Date  . CAD (coronary artery disease)   . Hypertension   . Diabetes mellitus     PSx:  Past Surgical History  Procedure Laterality Date  . Coronary artery bypass graft      LIMA LAD, SVG OM1/PLA, SVG AM/PDA, 1999  . Corneal transplant    . Back surgery      Lumbar  . Hemicolectomy      Diverticulitis  . Elbow surgery      Left  . Wrist surgery      Right    Family Hx:  Family History  Problem Relation Age of Onset  . Adopted: Yes    Social Hx:  reports that he has been smoking Cigarettes.  He has been smoking about 0.50 packs per day. He does not have any smokeless tobacco history on file. He reports that he does not drink alcohol. His drug history is not on file.  Allergies:  Allergies  Allergen Reactions  . Morphine Other (See Comments)    Years ago. Hives. Pt states has been on this before with no reaction    Medications: I have reviewed the patient's current  medications.  Labs: No results found for this or any previous visit (from the past 48 hour(s)).  Radiology: Ct Maxillofacial W/cm  04/08/2013   CLINICAL DATA:  Left facial swelling.  Rule out abscess.  EXAM: CT MAXILLOFACIAL WITH CONTRAST  TECHNIQUE: Multidetector CT imaging of the maxillofacial structures was performed with intravenous contrast. Multiplanar CT image reconstructions were also generated. A small metallic BB was placed on the right temple in order to reliably differentiate right from left.  CONTRAST:  50mL OMNIPAQUE IOHEXOL 300 MG/ML  SOLN  COMPARISON:  X-rays from earlier today  FINDINGS: Vitamin-E capsule marks an area of swelling overlying the left parotid gland. The left parotid gland is normal. No evidence of abscess or mass in the left parotid gland.  The left TMJ is abnormal. There is a joint effusion. There is a small bony fragment anterior to the condyle which could be a fracture fragment. The joint effusion could be due to the fracture. This also some mild erosive changes in the cortex and septic arthritis is not excluded. The joint space is widened with anterior subluxation of the left TMJ compared to the right TMJ which appears normal.  The  right parotid gland is normal. Submandibular gland is normal bilaterally. No mass or adenopathy. Visualized paranasal sinuses are clear.  IMPRESSION: Left parotid gland appears normal  Abnormal left TMJ with subluxation of the condyle. There is a joint effusion. There is a small bony fragment in the joint which may be due to acute fracture. Cannot exclude septic arthritis if there are clinical signs of infection.   Electronically Signed   By: Marlan Palauharles  Clark M.D.   On: 04/08/2013 15:04    OVF:IEPPIRJJOROS:Pertinent items are noted in HPI.  Vital Signs: BP 161/69  Pulse 95  Temp(Src) 99.1 F (37.3 C)  Resp 24  Ht 5\' 8"  (1.727 m)  Wt 94.802 kg (209 lb)  BMI 31.79 kg/m2  SpO2 92%  Physical Exam: General appearance: alert and cooperative Head:  Normocephalic, without obvious abnormality, atraumatic Eyes: conjunctivae/corneas clear. PERRL, EOM's intact. Fundi benign. Ears: normal TM's and external ear canals both ears Nose: Nares normal. Septum midline. Mucosa normal. No drainage or sinus tenderness. Throat: abnormal findings: dentition: fair and left posterior and anterior open bite.   His jaw deviates to the right when opening; his maximum vertical opening is approximately 20 mm, he has facial swelling associated with the left joint.  His cranial nerves V, VII are grossly intact.  He reports no difficulty hearing from either ear.  There is no erythema to the left face.  Assessment/Plan: Left TMJ joint effusion with subluxation and a free fragment of bone in the joint space. 1. I recommend consultation to medicine to manage his medical issues. 2. I would recommend US or CT guide FNA of the left joint effusion with cultures to microbiology/cytology. 3. The white count is elevated at 14.3.  My recommendation is that we empirically start IV antibiotics after the IR aspiration. 4. The patient may require a MRI of the left joint to access the condition of the TMJ disc and whether the bone is in anterior joint space or posterior joint space.  5. Once the patient's inflammation and swelling has improved. The patient may also require outpatient treatment and opening of his joint.  [place],Amadou Katzenstein L  04/08/2013, 6:10 PM

## 2013-04-08 NOTE — Progress Notes (Signed)
ANTIBIOTIC CONSULT NOTE - INITIAL  Pharmacy Consult for vancomycin + zosyn Indication: abscess/cellulitis  Allergies  Allergen Reactions  . Morphine Other (See Comments)    Years ago. Hives. Pt states has been on this before with no reaction    Patient Measurements: Height: 5\' 8"  (172.7 cm) Weight: 209 lb (94.802 kg) IBW/kg (Calculated) : 68.4 Adjusted Body Weight:   Vital Signs: Temp: 99.3 F (37.4 C) (01/21 1852) Temp src: Oral (01/21 1852) BP: 155/73 mmHg (01/21 1800) Pulse Rate: 101 (01/21 1852) Intake/Output from previous day:   Intake/Output from this shift:    Labs:  Recent Labs  04/08/13 1815  WBC 14.3*  HGB 12.4*  PLT 334  CREATININE 1.23   Estimated Creatinine Clearance: 66.9 ml/min (by C-G formula based on Cr of 1.23). No results found for this basename: VANCOTROUGH, VANCOPEAK, VANCORANDOM, GENTTROUGH, GENTPEAK, GENTRANDOM, TOBRATROUGH, TOBRAPEAK, TOBRARND, AMIKACINPEAK, AMIKACINTROU, AMIKACIN,  in the last 72 hours   Microbiology: No results found for this or any previous visit (from the past 720 hour(s)).  Medical History: Past Medical History  Diagnosis Date  . CAD (coronary artery disease)   . Hypertension   . Diabetes mellitus     Medications:  Anti-infectives   Start     Dose/Rate Route Frequency Ordered Stop   04/09/13 0800  vancomycin (VANCOCIN) IVPB 1000 mg/200 mL premix     1,000 mg 200 mL/hr over 60 Minutes Intravenous Every 12 hours 04/08/13 2006     04/09/13 0200  piperacillin-tazobactam (ZOSYN) IVPB 3.375 g     3.375 g 12.5 mL/hr over 240 Minutes Intravenous Every 8 hours 04/08/13 2006     04/08/13 1915  vancomycin (VANCOCIN) IVPB 1000 mg/200 mL premix     1,000 mg 200 mL/hr over 60 Minutes Intravenous  Once 04/08/13 1901     04/08/13 1915  piperacillin-tazobactam (ZOSYN) IVPB 3.375 g     3.375 g 100 mL/hr over 30 Minutes Intravenous  Once 04/08/13 1901 04/08/13 2006     Assessment: 67 yom presented to the ED with an  abscess. To start broad-spectrum antibiotics for treatment of abscess/cellulitis. Pt is afebrile and WBC is elevated at 14.3   Vanc 1/21>> Zosyn 1/21>>  Goal of Therapy:  Vancomycin trough level 10-15 mcg/ml  Plan:  1. Vancomycin 1gm IV Q12H 2. Zosyn 3.375gm IV Q8H (4 hr inf) 3. F/u renal fxn, C&S, [facility]  Artice Bergerson, Drake LeachRachel Lynn 04/08/2013,8:06 PM

## 2013-04-08 NOTE — ED Notes (Signed)
The pt was sent here from the doctors office with an abscess of the lt jaw or face.  He has had a mri with the results with him.  He reports that he bumped his face yesterday.  No toothache

## 2013-04-08 NOTE — H&P (Addendum)
[facility] History and Physical  [name] HCW:[id number] DOB: Nov 13, 1945 DOA: 04/08/2013  Referring physician:  PCP: Dwan Bolt, MD  Specialists:   Chief Complaint: L TMJ pain, swelling   HPI: [name] is a 68 y.o. male with PMH of HTN, HPL, DM, CAD s/p CABG, smoker presented with L TMJ pain, swelling with ? Trauma to that joint during workign in his barn, ED CT showed possible infection; labs pend; ED called oral surgeon who requested to be admitted and start IV atx;   -patient denies fever, no chest pain, no SOB, no dizziness, no nausea, vomiting or diarrea  Review of Systems: The patient denies anorexia, fever, weight loss,, vision loss, decreased hearing, hoarseness, chest pain, syncope, dyspnea on exertion, peripheral edema, balance deficits, hemoptysis, abdominal pain, melena, hematochezia, severe indigestion/heartburn, hematuria, incontinence, genital sores, muscle weakness, suspicious skin lesions, transient blindness, difficulty walking, depression, unusual weight change, abnormal bleeding, enlarged lymph nodes, angioedema, and breast masses.    Past Medical History  Diagnosis Date  . CAD (coronary artery disease)   . Hypertension   . Diabetes mellitus    Past Surgical History  Procedure Laterality Date  . Coronary artery bypass graft      LIMA LAD, SVG OM1/PLA, SVG AM/PDA, 1999  . Corneal transplant    . Back surgery      Lumbar  . Hemicolectomy      Diverticulitis  . Elbow surgery      Left  . Wrist surgery      Right   Social History:  reports that he has been smoking Cigarettes.  He has been smoking about 0.50 packs per day. He does not have any smokeless tobacco history on file. He reports that he does not drink alcohol. His drug history is not on file. Home;  where does patient live--home, ALF, SNF? and with whom if at home? Yes;  Can patient participate in ADLs?  Allergies  Allergen Reactions  . Morphine Other (See Comments)    Years  ago. Hives. Pt states has been on this before with no reaction    Family History  Problem Relation Age of Onset  . Adopted: Yes    (be sure to complete)  Prior to Admission medications   Medication Sig Start Date End Date Taking? Authorizing Provider  aspirin 81 MG tablet Take 81 mg by mouth daily.   Yes Historical Provider, MD  exenatide (BYETTA) 10 MCG/0.04ML SOLN Inject 10 mcg into the skin daily.    Yes Historical Provider, MD  ezetimibe (ZETIA) 10 MG tablet Take 10 mg by mouth daily.   Yes Historical Provider, MD  insulin glargine (LANTUS) 100 UNIT/ML injection Inject 25 Units into the skin at bedtime.    Yes Historical Provider, MD  insulin glulisine (APIDRA) 100 UNIT/ML injection Inject 20 Units into the skin daily.    Yes Historical Provider, MD  pioglitazone-metformin (ACTOPLUS MET) 15-850 MG per tablet Take 1 tablet by mouth 2 (two) times daily with a meal.   Yes Historical Provider, MD  pregabalin (LYRICA) 100 MG capsule Take 100 mg by mouth 2 (two) times daily.   Yes Historical Provider, MD  ramipril (ALTACE) 5 MG capsule Take 5 mg by mouth daily.   Yes Historical Provider, MD  rosuvastatin (CRESTOR) 20 MG tablet Take 20 mg by mouth daily.    Historical Provider, MD   Physical Exam: Filed Vitals:   04/08/13 1815  BP:   Pulse: 98  Temp:   Resp:  General:  alert  Eyes: eom-i  ENT: no oral ulcers, L TMJ tender,edema   Neck: supple   Cardiovascular: s1,s2 rrr  Respiratory: poor ventilation in LL  Abdomen: soft, nt, nd   Skin: no rash  Musculoskeletal: no LE edema  Psychiatric: no hallucinations   Neurologic: CN 2-12 intact; motor 5/5  Labs on Admission:  Basic Metabolic Panel: No results found for this basename: NA, K, CL, CO2, GLUCOSE, BUN, CREATININE, CALCIUM, MG, PHOS,  in the last 168 hours Liver Function Tests: No results found for this basename: AST, ALT, ALKPHOS, BILITOT, PROT, ALBUMIN,  in the last 168 hours No results found for this  basename: LIPASE, AMYLASE,  in the last 168 hours No results found for this basename: AMMONIA,  in the last 168 hours CBC:  Recent Labs Lab 04/08/13 1815  WBC 14.3*  NEUTROABS 11.7*  HGB 12.4*  HCT 38.2*  MCV 90.3  PLT 334   Cardiac Enzymes: No results found for this basename: CKTOTAL, CKMB, CKMBINDEX, TROPONINI,  in the last 168 hours  BNP (last 3 results) No results found for this basename: PROBNP,  in the last 8760 hours CBG: No results found for this basename: GLUCAP,  in the last 168 hours  Radiological Exams on Admission: Ct Maxillofacial W/cm  04/08/2013   CLINICAL DATA:  Left facial swelling.  Rule out abscess.  EXAM: CT MAXILLOFACIAL WITH CONTRAST  TECHNIQUE: Multidetector CT imaging of the maxillofacial structures was performed with intravenous contrast. Multiplanar CT image reconstructions were also generated. A small metallic BB was placed on the right temple in order to reliably differentiate right from left.  CONTRAST:  19m OMNIPAQUE IOHEXOL 300 MG/ML  SOLN  COMPARISON:  X-rays from earlier today  FINDINGS: Vitamin-E capsule marks an area of swelling overlying the left parotid gland. The left parotid gland is normal. No evidence of abscess or mass in the left parotid gland.  The left TMJ is abnormal. There is a joint effusion. There is a small bony fragment anterior to the condyle which could be a fracture fragment. The joint effusion could be due to the fracture. This also some mild erosive changes in the cortex and septic arthritis is not excluded. The joint space is widened with anterior subluxation of the left TMJ compared to the right TMJ which appears normal.  The right parotid gland is normal. Submandibular gland is normal bilaterally. No mass or adenopathy. Visualized paranasal sinuses are clear.  IMPRESSION: Left parotid gland appears normal  Abnormal left TMJ with subluxation of the condyle. There is a joint effusion. There is a small bony fragment in the joint which  may be due to acute fracture. Cannot exclude septic arthritis if there are clinical signs of infection.   Electronically Signed   By: CFranchot GalloM.D.   On: 04/08/2013 15:04   Dg Chest Port 1 View  04/08/2013   CLINICAL DATA:  Preop.  EXAM: PORTABLE CHEST - 1 VIEW  COMPARISON:  04/08/2013 at 12:31 p.m.  FINDINGS: Changes from CABG surgery are stable. The cardiac silhouette is mildly enlarged. Normal mediastinal and hilar contours. There is central vascular congestion and mild interstitial thickening. Additional lung base opacity is noted that is likely due to atelectasis. Allowing for differences in technique, there has been no significant change from the prior study. No pleural effusion or pneumothorax.  IMPRESSION: No convincing acute cardiopulmonary disease. Stable appearance from the earlier study.   Electronically Signed   By: DLajean ManesM.D.   On:  04/08/2013 18:44    EKG: Independently reviewed. NSR, no acute ST changes   Assessment/Plan Active Problems:   DIABETES MELLITUS, TYPE II   HYPERLIPIDEMIA   OBSTRUCTIVE SLEEP APNEA   HYPERTENSION   Tobacco abuse   TMJ disease   DM (diabetes mellitus)   HTN (hypertension)   68 y.o. male with PMH of HTN, HPL, DM, CAD s/p CABG, smoker presented with L TMJ pain, swelling and admitted with possible abscess, infected TMJ joint   1. TMJ pain, edema; r/o abscess vs traumatic effusion;  -CT: Abnormal left TMJ with subluxation of the condyle. There is a joint effusion. There is a small bony fragment in the joint which may be due to acute fracture. Cannot exclude septic arthritis if there are  clinical signs of infection. -oral surgeon recommended IR drainage, IV atx; labs still pend at the time of exam   2. HTN uncontrolled; cont home regimen, titrate meds;   3. CAD s/p CABG, no acute cardiopulmonary symptoms;  -cont home regimen   4. DM no recent HA1C; hold byetta; cont insulin regimen; check A1C;   pls f/u labs; still pend   Oral  surgery;  if consultant consulted, please document name and whether formally or informally consulted  Code Status: full (must indicate code status--if unknown or must be presumed, indicate so) Family Communication: d/w patient  (indicate person spoken with, if applicable, with phone number if by telephone) Disposition Plan: home when ready (indicate anticipated LOS)  Time spent: >35 minutes   Kinnie Feil [facility] Pager [phone]  If 7PM-7AM, please contact night-coverage [email or web address] Password [facility] 04/08/2013, 6:48 PM

## 2013-04-08 NOTE — ED Notes (Signed)
MD at bedside. 

## 2013-04-09 DIAGNOSIS — J159 Unspecified bacterial pneumonia: Secondary | ICD-10-CM

## 2013-04-09 DIAGNOSIS — N410 Acute prostatitis: Secondary | ICD-10-CM

## 2013-04-09 DIAGNOSIS — K5730 Diverticulosis of large intestine without perforation or abscess without bleeding: Secondary | ICD-10-CM

## 2013-04-09 LAB — GLUCOSE, CAPILLARY
GLUCOSE-CAPILLARY: 162 mg/dL — AB (ref 70–99)
Glucose-Capillary: 98 mg/dL (ref 70–99)
Glucose-Capillary: 134 mg/dL — ABNORMAL HIGH (ref 70–99)
Glucose-Capillary: 142 mg/dL — ABNORMAL HIGH (ref 70–99)

## 2013-04-09 LAB — HEMOGLOBIN A1C
Hgb A1c MFr Bld: 6.1 % — ABNORMAL HIGH (ref <5.7)
MEAN PLASMA GLUCOSE: 128 mg/dL — AB (ref <117)

## 2013-04-09 MED ORDER — OXYCODONE HCL 5 MG PO TABS
5.0000 mg | ORAL_TABLET | Freq: Four times a day (QID) | ORAL | Status: DC | PRN
  Administered 2013-04-09: 5 mg via ORAL
  Filled 2013-04-09: qty 1

## 2013-04-09 MED ORDER — HYDROMORPHONE HCL PF 1 MG/ML IJ SOLN
1.0000 mg | INTRAMUSCULAR | Status: DC | PRN
  Administered 2013-04-09 – 2013-04-11 (×10): 1 mg via INTRAVENOUS
  Filled 2013-04-09 (×10): qty 1

## 2013-04-09 MED ORDER — IBUPROFEN 600 MG PO TABS
600.0000 mg | ORAL_TABLET | Freq: Three times a day (TID) | ORAL | Status: DC | PRN
  Filled 2013-04-09: qty 1

## 2013-04-09 NOTE — Progress Notes (Signed)
Pt admitted to unit from ED. Pt A&O, VS stable, and skin intact. Pt oriented to unit, call bell within reach, & safety video viewed. Pt currently resting comfortably in bed. Will continue to monitor.

## 2013-04-09 NOTE — Progress Notes (Signed)
[name] is a 68 y.o. male patient admitted with left TMJ effusion/abscess.  Diagnosis:  1. Abscess   2. DM (diabetes mellitus)   3. HTN (hypertension)   4. Acute prostatitis   5. Bacterial pneumonia, unspecified   6. Diverticulosis of colon (without mention of hemorrhage)     PMHx:  Past Medical History  Diagnosis Date  . CAD (coronary artery disease)   . Hypertension   . Complication of anesthesia     "w/back OR, stopped breathing" (04/08/2013)  . Pneumonia     "twice" (04/08/2013)  . OSA on CPAP   . Type II diabetes mellitus   . Anemia   . History of blood transfusion     "related to colon OR" (04/08/2013)  . History of stomach ulcers ~ 2008  . Arthritis     "joints" (04/08/2013)  . Chronic lower back pain   . Gout attack     "twice; last time ~ 2008" (04/08/2013)  . Jaw fracture 04/06/2013    "left side; don't know how I did it" (04/08/2013)    Meds:  Current Facility-Administered Medications  Medication Dose Route Frequency Provider Last Rate Last Dose  . acetaminophen (TYLENOL) tablet 650 mg  650 mg Oral Q6H PRN Esperanza Sheets, MD       Or  . acetaminophen (TYLENOL) suppository 650 mg  650 mg Rectal Q6H PRN Esperanza Sheets, MD      . antiseptic oral rinse (BIOTENE) solution 15 mL  15 mL Mouth Rinse BID Esperanza Sheets, MD   15 mL at 04/09/13 1039  . aspirin EC tablet 81 mg  81 mg Oral Daily Esperanza Sheets, MD   81 mg at 04/09/13 1038  . atorvastatin (LIPITOR) tablet 20 mg  20 mg Oral q1800 Esperanza Sheets, MD      . enoxaparin (LOVENOX) injection 40 mg  40 mg Subcutaneous Q24H Esperanza Sheets, MD   40 mg at 04/08/13 2132  . ezetimibe (ZETIA) tablet 10 mg  10 mg Oral Daily Esperanza Sheets, MD   10 mg at 04/09/13 1038  . HYDROmorphone (DILAUDID) injection 1 mg  1 mg Intravenous Q4H PRN Clydia Llano, MD   1 mg at 04/09/13 1339  . ibuprofen (ADVIL,MOTRIN) tablet 600 mg  600 mg Oral TID PRN Leda Gauze, NP      . insulin aspart (novoLOG) injection 0-9  Units  0-9 Units Subcutaneous TID WC Esperanza Sheets, MD      . insulin glargine (LANTUS) injection 25 Units  25 Units Subcutaneous QHS Esperanza Sheets, MD   25 Units at 04/08/13 2237  . pioglitazone (ACTOS) tablet 15 mg  15 mg Oral BID WC Esperanza Sheets, MD       And  . metFORMIN (GLUCOPHAGE) tablet 850 mg  850 mg Oral BID WC Esperanza Sheets, MD      . ondansetron (ZOFRAN) tablet 4 mg  4 mg Oral Q6H PRN Esperanza Sheets, MD       Or  . ondansetron (ZOFRAN) injection 4 mg  4 mg Intravenous Q6H PRN Esperanza Sheets, MD   4 mg at 04/08/13 1950  . oxyCODONE (Oxy IR/ROXICODONE) immediate release tablet 5 mg  5 mg Oral Q6H PRN Leda Gauze, NP   5 mg at 04/09/13 0448  . piperacillin-tazobactam (ZOSYN) IVPB 3.375 g  3.375 g Intravenous Q8H Drake Leach Rumbarger, RPH   3.375 g at 04/09/13 1326  . pregabalin (LYRICA)  capsule 100 mg  100 mg Oral BID Esperanza SheetsUlugbek N Buriev, MD   100 mg at 04/09/13 [id number]  . vancomycin (VANCOCIN) IVPB 1000 mg/200 mL premix  1,000 mg Intravenous Q12H Drake Leachachel Lynn Rumbarger, RPH   1,000 mg at 04/09/13 1038    Allergies:  Allergies  Allergen Reactions  . Morphine Other (See Comments)    Years ago. Hives. Pt states has been on this before with no reaction    Problems: Active Problems:   DIABETES MELLITUS, TYPE II   HYPERLIPIDEMIA   OBSTRUCTIVE SLEEP APNEA   HYPERTENSION   Tobacco abuse   TMJ disease   DM (diabetes mellitus)   HTN (hypertension)   Abscess   Vitals: BP 154/77  Pulse 84  Temp(Src) 98.8 F (37.1 C) (Oral)  Resp 20  Ht 5' 9.5" (1.765 m)  Wt 92.262 kg (203 lb 6.4 oz)  BMI 29.62 kg/m2  SpO2 90%  Radiology: Dg Fluoro Rm 1-60 Min  04/08/2013   CLINICAL DATA:  Suspected inflammatory joint effusion in the left temporomandibular joint. Rule out septic joint.  EXAM: FLOURO RM 1-60 MIN  MEDICATIONS AND MEDICAL HISTORY: None  ANESTHESIA/SEDATION: None  CONTRAST:  None  FLUOROSCOPY TIME:  49 seconds.  PROCEDURE: The procedure, risks, benefits, and  alternatives were explained to the patient. Questions regarding the procedure were encouraged and answered. The patient understands and consents to the procedure.  The left pre-auricular location was prepped with Betadine in a sterile fashion, and a sterile drape was applied covering the operative field. A sterile gown and sterile gloves were used for the procedure.  1% lidocaine was utilized for local anesthesia. Under fluoroscopic guidance, a 20 gauge needle was advanced into the left temporomandibular joint. 1 cc clear fluid was aspirated without complication.  COMPLICATIONS: None  FINDINGS: Image documents needle placement in the left temporomandibular joint.  IMPRESSION: Successful left temporomandibular joint aspiration yielding clear fluid. This was sent for culture.   Electronically Signed   By: Maryclare BeanArt  Hoss M.D.   On: 04/08/2013 20:45   Ct Maxillofacial W/cm  04/08/2013   CLINICAL DATA:  Left facial swelling.  Rule out abscess.  EXAM: CT MAXILLOFACIAL WITH CONTRAST  TECHNIQUE: Multidetector CT imaging of the maxillofacial structures was performed with intravenous contrast. Multiplanar CT image reconstructions were also generated. A small metallic BB was placed on the right temple in order to reliably differentiate right from left.  CONTRAST:  50mL OMNIPAQUE IOHEXOL 300 MG/ML  SOLN  COMPARISON:  X-rays from earlier today  FINDINGS: Vitamin-E capsule marks an area of swelling overlying the left parotid gland. The left parotid gland is normal. No evidence of abscess or mass in the left parotid gland.  The left TMJ is abnormal. There is a joint effusion. There is a small bony fragment anterior to the condyle which could be a fracture fragment. The joint effusion could be due to the fracture. This also some mild erosive changes in the cortex and septic arthritis is not excluded. The joint space is widened with anterior subluxation of the left TMJ compared to the right TMJ which appears normal.  The right parotid  gland is normal. Submandibular gland is normal bilaterally. No mass or adenopathy. Visualized paranasal sinuses are clear.  IMPRESSION: Left parotid gland appears normal  Abnormal left TMJ with subluxation of the condyle. There is a joint effusion. There is a small bony fragment in the joint which may be due to acute fracture. Cannot exclude septic arthritis if there are clinical signs of  infection.   Electronically Signed   By: Marlan Palau M.D.   On: 04/08/2013 15:04   Dg Chest Port 1 View  04/08/2013   CLINICAL DATA:  Preop.  EXAM: PORTABLE CHEST - 1 VIEW  COMPARISON:  04/08/2013 at 12:31 p.m.  FINDINGS: Changes from CABG surgery are stable. The cardiac silhouette is mildly enlarged. Normal mediastinal and hilar contours. There is central vascular congestion and mild interstitial thickening. Additional lung base opacity is noted that is likely due to atelectasis. Allowing for differences in technique, there has been no significant change from the prior study. No pleural effusion or pneumothorax.  IMPRESSION: No convincing acute cardiopulmonary disease. Stable appearance from the earlier study.   Electronically Signed   By: Amie Portland M.D.   On: 04/08/2013 18:44    Left facial swelling remains, the patient is able to open 5 mm more today versus yesterday.  He still has a left posterior and anterior open bite.    A/P: The Fluoro guided aspiration yielded clear fluid and not purulent discharge which indicates that this is an acute TMJ subluxation injury with an effusion and extraoral swelling.    1. I recommend a TMJ MRI.  According to Radiology this can only be performed at [facility]. The patient's WBC is trending down and he has remained afebrile, therefore the patient could be discharged from [facility] with PO antibiotics that have the same coverage as the IV antibiotics and have the MRI as an outpatient on today or Monday. If the medicine team can help coordinate this it would be greatly  appreciated.  2. I will have the patient follow up as an outpatient in my office next week. His cultures currently show no growth, and I can follow those on an outpatient basis. [phone]   3. I also discussed having the patient seen by a TMJ subspecialist at [facility] Department of Oral and Maxillofacial Surgery.    4. If the patient is discharged he will need significant pain medication to manage his pain.  Her currently is needing his Dilaudid every 4 hours..     [place],Gyanna Jarema L 04/09/2013, 6:41 PM

## 2013-04-09 NOTE — Progress Notes (Signed)
UR completed. Patient changed to inpatient- requiring IV antibiotics.  

## 2013-04-09 NOTE — Progress Notes (Signed)
[facility] PROGRESS NOTE  [name] ZOX:[id number] DOB: 04-Jul-1945 DOA: 04/08/2013 PCP: Michiel Sites, MD  HPI/Subjective: Feels much better, has 7/10 pain, asking for more pain medications.  Assessment/Plan: Active Problems:   DIABETES MELLITUS, TYPE II   HYPERLIPIDEMIA   OBSTRUCTIVE SLEEP APNEA   HYPERTENSION   Tobacco abuse   TMJ disease   DM (diabetes mellitus)   HTN (hypertension)   Abscess   Left TMJ swelling -Rule out abscess versus traumatic effusion with small bony fragment in the joint. -The bony fragment in the joint consistent with acute fracture, suggesting this is might be traumatic effusion. -Maxillofacial surgery consulted, discussed the case with Dr. Jeanice Lim. -1 mL of clear fluid aspirated by IR, no evidence of pus, cell count and differential pending. -Patient started on empiric antibiotics, Zosyn and vancomycin.  Acute bronchitis -Patient has cough and sputum production with clear chest x-ray. -Patient is a smoker, also reported shortness of breath. -Will start on Mucinex and Robitussin, patient has wheezing but hold on steroids still the TMJ infection ruled out.  Diabetes mellitus type 2 -Controlled diabetes with hemoglobin A1c of 6.1. -Continue home insulin regimen, insulin sliding scale.  CAD status post CABG -No cardiopulmonary symptoms, continue home medications.  Hypertension -Uncontrolled hypertension, ramipril held because of underlying hypertension. -We'll titrate medications according to the readings in the hospital.  Code Status: Full code Family Communication: Plan discussed with the patient. Disposition Plan: Remains inpatient   Consultants:  Maxillofacial surgery, Dr. Jeanice Lim  Procedures:  None  Antibiotics:  Back and Zosyn   Objective: Filed Vitals:   04/09/13 1341  BP: 158/91  Pulse: 76  Temp:   Resp:     Intake/Output Summary (Last 24 hours) at 04/09/13 1348 Last data filed at 04/09/13 0900   Gross per 24 hour  Intake      0 ml  Output    775 ml  Net   -775 ml   Filed Weights   04/08/13 1620 04/08/13 2142  Weight: 94.802 kg (209 lb) 92.262 kg (203 lb 6.4 oz)    Exam: General: Alert and awake, oriented x3, not in any acute distress. HEENT: anicteric sclera, pupils reactive to light and accommodation, EOMI CVS: S1-S2 clear, no murmur rubs or gallops Chest: clear to auscultation bilaterally, no wheezing, rales or rhonchi Abdomen: soft nontender, nondistended, normal bowel sounds, no organomegaly Extremities: no cyanosis, clubbing or edema noted bilaterally Neuro: Cranial nerves II-XII intact, no focal neurological deficits  Data Reviewed: Basic Metabolic Panel:  Recent Labs Lab 04/08/13 1815  NA 143  K 5.3  CL 103  CO2 24  GLUCOSE 127*  BUN 17  CREATININE 1.23  CALCIUM 9.5   Liver Function Tests: No results found for this basename: AST, ALT, ALKPHOS, BILITOT, PROT, ALBUMIN,  in the last 168 hours No results found for this basename: LIPASE, AMYLASE,  in the last 168 hours No results found for this basename: AMMONIA,  in the last 168 hours CBC:  Recent Labs Lab 04/08/13 1815  WBC 14.3*  NEUTROABS 11.7*  HGB 12.4*  HCT 38.2*  MCV 90.3  PLT 334   Cardiac Enzymes: No results found for this basename: CKTOTAL, CKMB, CKMBINDEX, TROPONINI,  in the last 168 hours BNP (last 3 results) No results found for this basename: PROBNP,  in the last 8760 hours CBG:  Recent Labs Lab 04/08/13 2131 04/09/13 0754 04/09/13 1157  GLUCAP 142* 98 134*    Micro Recent Results (from the past 240 hour(s))  CULTURE, BLOOD (  ROUTINE X 2)     Status: None   Collection Time    04/08/13  6:15 PM      Result Value Range Status   Specimen Description BLOOD ARM LEFT   Final   Special Requests BOTTLES DRAWN AEROBIC ONLY 5CC   Final   Culture  Setup Time     Final   Value: 04/08/2013 23:16     Performed at [facility]   Culture     Final   Value:        BLOOD  CULTURE RECEIVED NO GROWTH TO DATE CULTURE WILL BE HELD FOR 5 DAYS BEFORE ISSUING A FINAL NEGATIVE REPORT     Performed at [facility]   Report Status PENDING   Incomplete  CULTURE, BLOOD (ROUTINE X 2)     Status: None   Collection Time    04/08/13  6:21 PM      Result Value Range Status   Specimen Description BLOOD HAND LEFT   Final   Special Requests BOTTLES DRAWN AEROBIC ONLY 8CC   Final   Culture  Setup Time     Final   Value: 04/08/2013 23:16     Performed at [facility]   Culture     Final   Value:        BLOOD CULTURE RECEIVED NO GROWTH TO DATE CULTURE WILL BE HELD FOR 5 DAYS BEFORE ISSUING A FINAL NEGATIVE REPORT     Performed at [facility]   Report Status PENDING   Incomplete  CULTURE, ROUTINE-ABSCESS     Status: None   Collection Time    04/08/13  8:55 PM      Result Value Range Status   Specimen Description ABSCESS MANDIBLE   Final   Special Requests TMJ ASPIRATION   Final   Gram Stain     Final   Value: FEW WBC PRESENT,BOTH PMN AND MONONUCLEAR     NO SQUAMOUS EPITHELIAL CELLS SEEN     NO ORGANISMS SEEN     Performed at [facility]   Culture PENDING   Incomplete   Report Status PENDING   Incomplete     Studies: Dg Fluoro Rm 1-60 Min  04/08/2013   CLINICAL DATA:  Suspected inflammatory joint effusion in the left temporomandibular joint. Rule out septic joint.  EXAM: FLOURO RM 1-60 MIN  MEDICATIONS AND MEDICAL HISTORY: None  ANESTHESIA/SEDATION: None  CONTRAST:  None  FLUOROSCOPY TIME:  49 seconds.  PROCEDURE: The procedure, risks, benefits, and alternatives were explained to the patient. Questions regarding the procedure were encouraged and answered. The patient understands and consents to the procedure.  The left pre-auricular location was prepped with Betadine in a sterile fashion, and a sterile drape was applied covering the operative field. A sterile gown and sterile gloves were used for the procedure.  1% lidocaine was utilized for  local anesthesia. Under fluoroscopic guidance, a 20 gauge needle was advanced into the left temporomandibular joint. 1 cc clear fluid was aspirated without complication.  COMPLICATIONS: None  FINDINGS: Image documents needle placement in the left temporomandibular joint.  IMPRESSION: Successful left temporomandibular joint aspiration yielding clear fluid. This was sent for culture.   Electronically Signed   By: Maryclare BeanArt  Hoss M.D.   On: 04/08/2013 20:45   Ct Maxillofacial W/cm  04/08/2013   CLINICAL DATA:  Left facial swelling.  Rule out abscess.  EXAM: CT MAXILLOFACIAL WITH CONTRAST  TECHNIQUE: Multidetector CT imaging of the maxillofacial structures was  performed with intravenous contrast. Multiplanar CT image reconstructions were also generated. A small metallic BB was placed on the right temple in order to reliably differentiate right from left.  CONTRAST:  50mL OMNIPAQUE IOHEXOL 300 MG/ML  SOLN  COMPARISON:  X-rays from earlier today  FINDINGS: Vitamin-E capsule marks an area of swelling overlying the left parotid gland. The left parotid gland is normal. No evidence of abscess or mass in the left parotid gland.  The left TMJ is abnormal. There is a joint effusion. There is a small bony fragment anterior to the condyle which could be a fracture fragment. The joint effusion could be due to the fracture. This also some mild erosive changes in the cortex and septic arthritis is not excluded. The joint space is widened with anterior subluxation of the left TMJ compared to the right TMJ which appears normal.  The right parotid gland is normal. Submandibular gland is normal bilaterally. No mass or adenopathy. Visualized paranasal sinuses are clear.  IMPRESSION: Left parotid gland appears normal  Abnormal left TMJ with subluxation of the condyle. There is a joint effusion. There is a small bony fragment in the joint which may be due to acute fracture. Cannot exclude septic arthritis if there are clinical signs of  infection.   Electronically Signed   By: Marlan Palau M.D.   On: 04/08/2013 15:04   Dg Chest Port 1 View  04/08/2013   CLINICAL DATA:  Preop.  EXAM: PORTABLE CHEST - 1 VIEW  COMPARISON:  04/08/2013 at 12:31 p.m.  FINDINGS: Changes from CABG surgery are stable. The cardiac silhouette is mildly enlarged. Normal mediastinal and hilar contours. There is central vascular congestion and mild interstitial thickening. Additional lung base opacity is noted that is likely due to atelectasis. Allowing for differences in technique, there has been no significant change from the prior study. No pleural effusion or pneumothorax.  IMPRESSION: No convincing acute cardiopulmonary disease. Stable appearance from the earlier study.   Electronically Signed   By: Amie Portland M.D.   On: 04/08/2013 18:44    Scheduled Meds: . antiseptic oral rinse  15 mL Mouth Rinse BID  . aspirin EC  81 mg Oral Daily  . atorvastatin  20 mg Oral q1800  . enoxaparin (LOVENOX) injection  40 mg Subcutaneous Q24H  . ezetimibe  10 mg Oral Daily  . insulin aspart  0-9 Units Subcutaneous TID WC  . insulin glargine  25 Units Subcutaneous QHS  . pioglitazone  15 mg Oral BID WC   And  . metFORMIN  850 mg Oral BID WC  . piperacillin-tazobactam (ZOSYN)  IV  3.375 g Intravenous Q8H  . pregabalin  100 mg Oral BID  . vancomycin  1,000 mg Intravenous Q12H   Continuous Infusions:      Time spent: 35 minutes    [facility] A  [facility] Pager [phone] If 7PM-7AM, please contact night-coverage at [email or web address], password [facility] 04/09/2013, 1:48 PM  LOS: 1 day

## 2013-04-10 DIAGNOSIS — J209 Acute bronchitis, unspecified: Secondary | ICD-10-CM | POA: Diagnosis present

## 2013-04-10 DIAGNOSIS — E1149 Type 2 diabetes mellitus with other diabetic neurological complication: Secondary | ICD-10-CM

## 2013-04-10 DIAGNOSIS — K219 Gastro-esophageal reflux disease without esophagitis: Secondary | ICD-10-CM

## 2013-04-10 DIAGNOSIS — M26609 Unspecified temporomandibular joint disorder, unspecified side: Secondary | ICD-10-CM

## 2013-04-10 LAB — BASIC METABOLIC PANEL
BUN: 13 mg/dL (ref 6–23)
CO2: 27 mEq/L (ref 19–32)
Calcium: 9.5 mg/dL (ref 8.4–10.5)
Chloride: 97 mEq/L (ref 96–112)
Creatinine, Ser: 1.36 mg/dL — ABNORMAL HIGH (ref 0.50–1.35)
GFR calc Af Amer: 61 mL/min — ABNORMAL LOW (ref >90)
GFR, EST NON AFRICAN AMERICAN: 53 mL/min — AB (ref >90)
Glucose, Bld: 126 mg/dL — ABNORMAL HIGH (ref 70–99)
Potassium: 4.5 mEq/L (ref 3.7–5.3)
SODIUM: 138 meq/L (ref 137–147)

## 2013-04-10 LAB — GLUCOSE, CAPILLARY
GLUCOSE-CAPILLARY: 111 mg/dL — AB (ref 70–99)
GLUCOSE-CAPILLARY: 139 mg/dL — AB (ref 70–99)
Glucose-Capillary: 134 mg/dL — ABNORMAL HIGH (ref 70–99)
Glucose-Capillary: 106 mg/dL — ABNORMAL HIGH (ref 70–99)

## 2013-04-10 LAB — CBC
HCT: 36.6 % — ABNORMAL LOW (ref 39.0–52.0)
Hemoglobin: 12.1 g/dL — ABNORMAL LOW (ref 13.0–17.0)
MCH: 29.4 pg (ref 26.0–34.0)
MCHC: 33.1 g/dL (ref 30.0–36.0)
MCV: 89.1 fL (ref 78.0–100.0)
Platelets: 329 10*3/uL (ref 150–400)
RBC: 4.11 MIL/uL — ABNORMAL LOW (ref 4.22–5.81)
RDW: 15.1 % (ref 11.5–15.5)
WBC: 11.3 10*3/uL — AB (ref 4.0–10.5)

## 2013-04-10 MED ORDER — SODIUM CHLORIDE 0.9 % IV SOLN
INTRAVENOUS | Status: DC
  Administered 2013-04-10 – 2013-04-11 (×2): via INTRAVENOUS

## 2013-04-10 MED ORDER — HYDROCOD POLST-CHLORPHEN POLST 10-8 MG/5ML PO LQCR
5.0000 mL | Freq: Two times a day (BID) | ORAL | Status: DC | PRN
  Administered 2013-04-10 – 2013-04-11 (×2): 5 mL via ORAL
  Filled 2013-04-10 (×2): qty 5

## 2013-04-10 NOTE — Progress Notes (Signed)
[facility] PROGRESS NOTE  [name] ZOX:[id number] DOB: 11/08/1945 DOA: 04/08/2013 PCP: Michiel Sites, MD  HPI/Subjective: Overall feel was better, pain is better. Before discharge in the morning  Assessment/Plan: Active Problems:   DIABETES MELLITUS, TYPE II   HYPERLIPIDEMIA   OBSTRUCTIVE SLEEP APNEA   HYPERTENSION   Tobacco abuse   TMJ disease   DM (diabetes mellitus)   HTN (hypertension)   Abscess   Left TMJ swelling -Rule out abscess versus traumatic effusion with small bony fragment in the joint. -The bony fragment in the joint consistent with acute fracture, suggesting this is might be traumatic effusion. -Maxillofacial surgery consulted, discussed the case with Dr. Jeanice Lim. -1 mL of clear fluid aspirated by IR, no evidence of pus, cell count and differential pending. -Patient started on empiric antibiotics, Zosyn and vancomycin. -Leukocytosis improving with antibiotics, feeling better overall we'll continue antibiotics for now. -Patient will need MRI as outpatient likely on Monday morning.  Acute bronchitis -Patient has cough and sputum production with clear chest x-ray. -Patient is a smoker, also reported shortness of breath. -Will start on Mucinex and Robitussin, patient has wheezing but hold on steroids still the TMJ infection ruled out.  Diabetes mellitus type 2 -Controlled diabetes with hemoglobin A1c of 6.1. -Continue home insulin regimen, insulin sliding scale.  CAD status post CABG -No cardiopulmonary symptoms, continue home medications.  Hypertension -Uncontrolled hypertension, ramipril held because of clinical dehydration/hypokalemia. -We'll titrate medications according to the readings in the hospital.  Code Status: Full code Family Communication: Plan discussed with the patient. Disposition Plan: Remains inpatient   Consultants:  Maxillofacial surgery, Dr. Jeanice Lim  Procedures:  None  Antibiotics:  Back and  Zosyn   Objective: Filed Vitals:   04/10/13 0409  BP: 147/86  Pulse: 75  Temp: 97.6 F (36.4 C)  Resp: 20    Intake/Output Summary (Last 24 hours) at 04/10/13 1115 Last data filed at 04/10/13 0926  Gross per 24 hour  Intake      0 ml  Output   1350 ml  Net  -1350 ml   Filed Weights   04/08/13 1620 04/08/13 2142  Weight: 94.802 kg (209 lb) 92.262 kg (203 lb 6.4 oz)    Exam: General: Alert and awake, oriented x3, not in any acute distress. HEENT: anicteric sclera, pupils reactive to light and accommodation, EOMI CVS: S1-S2 clear, no murmur rubs or gallops Chest: clear to auscultation bilaterally, no wheezing, rales or rhonchi Abdomen: soft nontender, nondistended, normal bowel sounds, no organomegaly Extremities: no cyanosis, clubbing or edema noted bilaterally Neuro: Cranial nerves II-XII intact, no focal neurological deficits  Data Reviewed: Basic Metabolic Panel:  Recent Labs Lab 04/08/13 1815 04/10/13 0531  NA 143 138  K 5.3 4.5  CL 103 97  CO2 24 27  GLUCOSE 127* 126*  BUN 17 13  CREATININE 1.23 1.36*  CALCIUM 9.5 9.5   Liver Function Tests: No results found for this basename: AST, ALT, ALKPHOS, BILITOT, PROT, ALBUMIN,  in the last 168 hours No results found for this basename: LIPASE, AMYLASE,  in the last 168 hours No results found for this basename: AMMONIA,  in the last 168 hours CBC:  Recent Labs Lab 04/08/13 1815 04/10/13 0531  WBC 14.3* 11.3*  NEUTROABS 11.7*  --   HGB 12.4* 12.1*  HCT 38.2* 36.6*  MCV 90.3 89.1  PLT 334 329   Cardiac Enzymes: No results found for this basename: CKTOTAL, CKMB, CKMBINDEX, TROPONINI,  in the last 168 hours BNP (last 3  results) No results found for this basename: PROBNP,  in the last 8760 hours CBG:  Recent Labs Lab 04/09/13 0754 04/09/13 1157 04/09/13 1649 04/09/13 2129 04/10/13 0756  GLUCAP 98 134* 162* 142* 134*    Micro Recent Results (from the past 240 hour(s))  CULTURE, BLOOD (ROUTINE X  2)     Status: None   Collection Time    04/08/13  6:15 PM      Result Value Range Status   Specimen Description BLOOD ARM LEFT   Final   Special Requests BOTTLES DRAWN AEROBIC ONLY 5CC   Final   Culture  Setup Time     Final   Value: 04/08/2013 23:16     Performed at [facility]   Culture     Final   Value:        BLOOD CULTURE RECEIVED NO GROWTH TO DATE CULTURE WILL BE HELD FOR 5 DAYS BEFORE ISSUING A FINAL NEGATIVE REPORT     Performed at [facility]   Report Status PENDING   Incomplete  CULTURE, BLOOD (ROUTINE X 2)     Status: None   Collection Time    04/08/13  6:21 PM      Result Value Range Status   Specimen Description BLOOD HAND LEFT   Final   Special Requests BOTTLES DRAWN AEROBIC ONLY 8CC   Final   Culture  Setup Time     Final   Value: 04/08/2013 23:16     Performed at [facility]   Culture     Final   Value:        BLOOD CULTURE RECEIVED NO GROWTH TO DATE CULTURE WILL BE HELD FOR 5 DAYS BEFORE ISSUING A FINAL NEGATIVE REPORT     Performed at [facility]   Report Status PENDING   Incomplete  CULTURE, ROUTINE-ABSCESS     Status: None   Collection Time    04/08/13  8:55 PM      Result Value Range Status   Specimen Description ABSCESS MANDIBLE   Final   Special Requests TMJ ASPIRATION   Final   Gram Stain     Final   Value: FEW WBC PRESENT,BOTH PMN AND MONONUCLEAR     NO SQUAMOUS EPITHELIAL CELLS SEEN     NO ORGANISMS SEEN     Performed at [facility]   Culture     Final   Value: NO GROWTH 1 DAY     Performed at [facility]   Report Status PENDING   Incomplete     Studies: Dg Fluoro Rm 1-60 Min  04/08/2013   CLINICAL DATA:  Suspected inflammatory joint effusion in the left temporomandibular joint. Rule out septic joint.  EXAM: FLOURO RM 1-60 MIN  MEDICATIONS AND MEDICAL HISTORY: None  ANESTHESIA/SEDATION: None  CONTRAST:  None  FLUOROSCOPY TIME:  49 seconds.  PROCEDURE: The procedure, risks, benefits, and  alternatives were explained to the patient. Questions regarding the procedure were encouraged and answered. The patient understands and consents to the procedure.  The left pre-auricular location was prepped with Betadine in a sterile fashion, and a sterile drape was applied covering the operative field. A sterile gown and sterile gloves were used for the procedure.  1% lidocaine was utilized for local anesthesia. Under fluoroscopic guidance, a 20 gauge needle was advanced into the left temporomandibular joint. 1 cc clear fluid was aspirated without complication.  COMPLICATIONS: None  FINDINGS: Image documents needle placement in the left temporomandibular  joint.  IMPRESSION: Successful left temporomandibular joint aspiration yielding clear fluid. This was sent for culture.   Electronically Signed   By: Maryclare Bean M.D.   On: 04/08/2013 20:45   Ct Maxillofacial W/cm  04/08/2013   CLINICAL DATA:  Left facial swelling.  Rule out abscess.  EXAM: CT MAXILLOFACIAL WITH CONTRAST  TECHNIQUE: Multidetector CT imaging of the maxillofacial structures was performed with intravenous contrast. Multiplanar CT image reconstructions were also generated. A small metallic BB was placed on the right temple in order to reliably differentiate right from left.  CONTRAST:  50mL OMNIPAQUE IOHEXOL 300 MG/ML  SOLN  COMPARISON:  X-rays from earlier today  FINDINGS: Vitamin-E capsule marks an area of swelling overlying the left parotid gland. The left parotid gland is normal. No evidence of abscess or mass in the left parotid gland.  The left TMJ is abnormal. There is a joint effusion. There is a small bony fragment anterior to the condyle which could be a fracture fragment. The joint effusion could be due to the fracture. This also some mild erosive changes in the cortex and septic arthritis is not excluded. The joint space is widened with anterior subluxation of the left TMJ compared to the right TMJ which appears normal.  The right parotid  gland is normal. Submandibular gland is normal bilaterally. No mass or adenopathy. Visualized paranasal sinuses are clear.  IMPRESSION: Left parotid gland appears normal  Abnormal left TMJ with subluxation of the condyle. There is a joint effusion. There is a small bony fragment in the joint which may be due to acute fracture. Cannot exclude septic arthritis if there are clinical signs of infection.   Electronically Signed   By: Marlan Palau M.D.   On: 04/08/2013 15:04   Dg Chest Port 1 View  04/08/2013   CLINICAL DATA:  Preop.  EXAM: PORTABLE CHEST - 1 VIEW  COMPARISON:  04/08/2013 at 12:31 p.m.  FINDINGS: Changes from CABG surgery are stable. The cardiac silhouette is mildly enlarged. Normal mediastinal and hilar contours. There is central vascular congestion and mild interstitial thickening. Additional lung base opacity is noted that is likely due to atelectasis. Allowing for differences in technique, there has been no significant change from the prior study. No pleural effusion or pneumothorax.  IMPRESSION: No convincing acute cardiopulmonary disease. Stable appearance from the earlier study.   Electronically Signed   By: Amie Portland M.D.   On: 04/08/2013 18:44    Scheduled Meds: . antiseptic oral rinse  15 mL Mouth Rinse BID  . aspirin EC  81 mg Oral Daily  . atorvastatin  20 mg Oral q1800  . enoxaparin (LOVENOX) injection  40 mg Subcutaneous Q24H  . ezetimibe  10 mg Oral Daily  . insulin aspart  0-9 Units Subcutaneous TID WC  . insulin glargine  25 Units Subcutaneous QHS  . pioglitazone  15 mg Oral BID WC   And  . metFORMIN  850 mg Oral BID WC  . piperacillin-tazobactam (ZOSYN)  IV  3.375 g Intravenous Q8H  . pregabalin  100 mg Oral BID  . vancomycin  1,000 mg Intravenous Q12H   Continuous Infusions: . sodium chloride 100 mL/hr at 04/10/13 0753       Time spent: 35 minutes    [facility]  [facility] Pager [phone] If 7PM-7AM, please contact night-coverage at  [email or web address], password [facility] 04/10/2013, 11:15 AM  LOS: 2 days

## 2013-04-10 NOTE — Progress Notes (Addendum)
[name] is a 68 y.o. male patient admitted with left TMJ effusion, fracture, and subluxation.  Diagnosis:  1. Abscess   2. DM (diabetes mellitus)   3. HTN (hypertension)   4. Acute prostatitis   5. Bacterial pneumonia, unspecified   6. Diverticulosis of colon (without mention of hemorrhage)   7. Esophageal reflux   8. TMJ disease   9. Type II or unspecified type diabetes mellitus with neurological manifestations, not stated as uncontrolled   10. Type II or unspecified type diabetes mellitus without mention of complication, not stated as uncontrolled     PMHx:  Past Medical History  Diagnosis Date  . CAD (coronary artery disease)   . Hypertension   . Complication of anesthesia     "w/back OR, stopped breathing" (04/08/2013)  . Pneumonia     "twice" (04/08/2013)  . OSA on CPAP   . Type II diabetes mellitus   . Anemia   . History of blood transfusion     "related to colon OR" (04/08/2013)  . History of stomach ulcers ~ 2008  . Arthritis     "joints" (04/08/2013)  . Chronic lower back pain   . Gout attack     "twice; last time ~ 2008" (04/08/2013)  . Jaw fracture 04/06/2013    "left side; don't know how I did it" (04/08/2013)    Allergies:  Allergies  Allergen Reactions  . Morphine Other (See Comments)    Years ago. Hives. Pt states has been on this before with no reaction    Problems: Active Problems:   DIABETES MELLITUS, TYPE II   HYPERLIPIDEMIA   OBSTRUCTIVE SLEEP APNEA   HYPERTENSION   Tobacco abuse   TMJ disease   DM (diabetes mellitus)   HTN (hypertension)   Abscess   Acute bronchitis   Vitals: BP 164/91  Pulse 76  Temp(Src) 98 F (36.7 C) (Oral)  Resp 20  Ht 5' 9.5" (1.765 m)  Wt 92.262 kg (203 lb 6.4 oz)  BMI 29.62 kg/m2  SpO2 97% Radiology: No results found.  Left facial swelling present, but greatly improved, the patient is able to open 25 to 30 mm. He still has a left posterior and anterior open bite when he closes.   A/P:  The patient has  an acute leftTMJ subluxation injury with an effusion and a fractured fragment of bone in the joint space.  The patient still does not know how this happened and denies trauma to the face.   1. I recommend discharge to home. I also recommend a TMJ MRI. According to Radiology this [facility] be [facility].  Will call on Monday morning to schedule.   2. The patient's WBC is trending down and he has remained afebrile, therefore the patient can be [facility] with PO antibiotics (Augmentin 875 bid x 7 days) that have the same coverage as the IV antibiotics.  2. I will have the patient follow up as an outpatient in my office next week. His cultures currently show no growth, and I can follow those on an outpatient basis. [phone]   3. I also discussed having the patient seen by a TMJ subspecialist - [facility] Prec[facility]aun [facility] of [facility] and Maxillofacial Surgery.   4. If the patient is discharged he will need significant pain medication to manage his pain. He currently is needing his Dilaudid every 4 hours. I would recommend that we discharge the patient with PO dilaudid.  5. The patient is planning to go out of town on [place] to [place].  I stressed the importance of following up as indicated in order to treat his joint condition.   Hornbrook,May Ozment L 04/10/2013, 10:34 PM

## 2013-04-10 NOTE — Care Management Note (Signed)
    Page 1 of 1   04/10/2013     4:57:19 PM   CARE MANAGEMENT NOTE 04/10/2013  Patient:  Diamond NickelHOREY,Eufemio A   Account Number:  [id number]  Date Initiated:  04/10/2013  Documentation initiated by:  Letha CapeAYLOR,Valen Mascaro  Subjective/Objective Assessment:   dx dm  admit- lives with spouse.     Action/Plan:   Anticipated DC Date:  04/11/2013   Anticipated DC Plan:  HOME/SELF CARE      DC Planning Services  CM consult      Choice offered to / List presented to:             Status of service:  In process, will continue to follow Medicare Important Message given?   (If response is "NO", the following Medicare IM given date fields will be blank) Date Medicare IM given:   Date Additional Medicare IM given:    Discharge Disposition:    Per UR Regulation:  Reviewed for med. necessity/level of care/duration of stay  If discussed at Long Length of Stay Meetings, dates discussed:    Comments:

## 2013-04-11 ENCOUNTER — Inpatient Hospital Stay (HOSPITAL_COMMUNITY): Payer: Medicare Other

## 2013-04-11 DIAGNOSIS — N390 Urinary tract infection, site not specified: Secondary | ICD-10-CM

## 2013-04-11 DIAGNOSIS — J209 Acute bronchitis, unspecified: Secondary | ICD-10-CM

## 2013-04-11 LAB — BASIC METABOLIC PANEL
BUN: 11 mg/dL (ref 6–23)
CALCIUM: 9 mg/dL (ref 8.4–10.5)
CO2: 29 mEq/L (ref 19–32)
Chloride: 102 mEq/L (ref 96–112)
Creatinine, Ser: 1.35 mg/dL (ref 0.50–1.35)
GFR calc Af Amer: 62 mL/min — ABNORMAL LOW (ref >90)
GFR, EST NON AFRICAN AMERICAN: 54 mL/min — AB (ref >90)
GLUCOSE: 60 mg/dL — AB (ref 70–99)
Potassium: 4.5 mEq/L (ref 3.7–5.3)
Sodium: 144 mEq/L (ref 137–147)

## 2013-04-11 LAB — CBC
HCT: 35.5 % — ABNORMAL LOW (ref 39.0–52.0)
Hemoglobin: 11.8 g/dL — ABNORMAL LOW (ref 13.0–17.0)
MCH: 29.1 pg (ref 26.0–34.0)
MCHC: 33.2 g/dL (ref 30.0–36.0)
MCV: 87.4 fL (ref 78.0–100.0)
PLATELETS: 353 10*3/uL (ref 150–400)
RBC: 4.06 MIL/uL — AB (ref 4.22–5.81)
RDW: 14.5 % (ref 11.5–15.5)
WBC: 7.7 10*3/uL (ref 4.0–10.5)

## 2013-04-11 LAB — GLUCOSE, CAPILLARY
Glucose-Capillary: 53 mg/dL — ABNORMAL LOW (ref 70–99)
Glucose-Capillary: 86 mg/dL (ref 70–99)

## 2013-04-11 MED ORDER — OXYCODONE-ACETAMINOPHEN 5-325 MG PO TABS: 1.0000 | ORAL_TABLET | ORAL | Status: AC | PRN

## 2013-04-11 MED ORDER — AMOXICILLIN-POT CLAVULANATE 875-125 MG PO TABS: 1.0000 | ORAL_TABLET | Freq: Two times a day (BID) | ORAL | Status: AC

## 2013-04-11 MED ORDER — INSULIN GLARGINE 100 UNIT/ML ~~LOC~~ SOLN: 20.0000 [IU] | Freq: Every day | SUBCUTANEOUS | Status: AC

## 2013-04-11 NOTE — Progress Notes (Signed)
Hypoglycemic Event  CBG: 53  Treatment: 15 GM carbohydrate snack  Symptoms: None  Follow-up CBG: Time:810 CBG Result:86  Possible Reasons for Event: Unknown  Comments/MD notified:Elmahi    [name]  Remember to initiate Hypoglycemia Order Set & complete

## 2013-04-11 NOTE — Progress Notes (Signed)
NURSING PROGRESS NOTE  JAHAN FRIEDLANDER [id number] Discharge Data: 04/11/2013 2:45 PM Attending Provider: Verlee Monte, MD [id number] DENNIS, MD     Laurence Ferrari to be D/C'd Home per MD order.    All IV's discontinued with no bleeding noted.  All belongings returned to patient for patient to take home.   Last Vital Signs:  Blood pressure 136/87, pulse 74, temperature 98 F (36.7 C), temperature source Oral, resp. rate 20, height 5' 9.5" (1.765 m), weight 92.262 kg (203 lb 6.4 oz), SpO2 95.00%.  Discharge Medication List   Medication List         amoxicillin-clavulanate 875-125 MG per tablet  Commonly known as:  AUGMENTIN  Take 1 tablet by mouth 2 (two) times daily.     aspirin 81 MG tablet  Take 81 mg by mouth daily.     atorvastatin 40 MG tablet  Commonly known as:  LIPITOR  Take 40 mg by mouth at bedtime.     exenatide 10 MCG/0.04ML Sopn injection  Commonly known as:  BYETTA  Inject 10 mcg into the skin daily.     ezetimibe 10 MG tablet  Commonly known as:  ZETIA  Take 10 mg by mouth daily.     insulin glargine 100 UNIT/ML injection  Commonly known as:  LANTUS  Inject 0.2 mLs (20 Units total) into the skin at bedtime.     insulin glulisine 100 UNIT/ML injection  Commonly known as:  APIDRA  Inject 20 Units into the skin daily.     metoprolol 100 MG tablet  Commonly known as:  LOPRESSOR  Take 100 mg by mouth 2 (two) times daily.     oxyCODONE-acetaminophen 5-325 MG per tablet  Commonly known as:  ROXICET  Take 1-2 tablets by mouth every 4 (four) hours as needed for severe pain.     pioglitazone-metformin 15-850 MG per tablet  Commonly known as:  ACTOPLUS MET  Take 1 tablet by mouth 2 (two) times daily with a meal.     pregabalin 100 MG capsule  Commonly known as:  LYRICA  Take 100 mg by mouth 2 (two) times daily.        Joslyn Hy, MSN, RN, [employer]

## 2013-04-11 NOTE — Progress Notes (Signed)
Patient refused CPAP at this time due to facial swelling post op surgery.  He states he is going home tomorrow and will be fine tonight.  Patient is encouraged to call for CPAP if desired.

## 2013-04-11 NOTE — Discharge Summary (Signed)
Physician Discharge Summary  [name] JXB:[id number] DOB: [birth year] DOA: 04/08/2013  PCP: Dwan Bolt, MD  Admit date: 04/08/2013 Discharge date: 04/11/2013  Time spent: 40 minutes  Recommendations for Outpatient Follow-up:  1. Followup with Dr. Tamela Oddi next week. 2. Followup with primary care physician within one week  Discharge Diagnoses:  Active Problems:   DIABETES MELLITUS, TYPE II   HYPERLIPIDEMIA   OBSTRUCTIVE SLEEP APNEA   HYPERTENSION   Tobacco abuse   TMJ disease   DM (diabetes mellitus)   HTN (hypertension)   Abscess   Acute bronchitis   Discharge Condition: Stable  Diet recommendation: Mechanical soft, carbohydrate modified diet  Filed Weights   04/08/13 1620 04/08/13 2142  Weight: 94.802 kg (209 lb) 92.262 kg (203 lb 6.4 oz)    History of present illness:  [name] is a 68 y.o. male with PMH of HTN, HPL, DM, CAD s/p CABG, smoker presented with L TMJ pain, swelling with ? Trauma to that joint during workign in his barn, ED CT showed possible infection; labs pend; ED called oral surgeon who requested to be admitted and start IV atx;  -patient denies fever, no chest pain, no SOB, no dizziness, no nausea, vomiting or diarrea  Hospital Course:   1. Left TMJ swelling: Patient admitted to the hospital with left TMJ swelling, redness and pain. Rule out abscess versus traumatic effusion. Patient had a small bony fragment in his joint. Patient denied any trauma. It was not clear if the bony fragment is secondary to trauma or pathological fracture from infection. At the time of admission maxillofacial surgery Dr. Buelah Manis consulted, recommended joint space aspiration. IR aspirated 1 mL of clear fluids from the left TMJ, showed no WBC or bacteria. Patient placed on Zosyn and vancomycin empirically at the time of admission, the time of discharge placed on Augmentin for 7 more days. Patient will have an MRI today prior to discharge, patient to followup  with Dr. Buelah Manis for further evaluation.  2. Acute bronchitis: He had cough, sputum production with clear chest x-ray, he is smoker also reported shortness of breath. Started on Mucinex and Robitussin. Patient already on antibiotics for his presumed left TMJ infection. At the time of discharge he discharged on Augmentin.  3. Diabetes mellitus type 2: Controlled diabetes mellitus, A1c is 6.1 indicating tight glycemic control, home medications continued. Patient did have episode of hypoglycemia with glucose of 56, likely secondary to poor oral intake with his TMJ swelling. Patient asked to decrease Lantus insulin to 20 units still has oral intake picks up.  4. Hypertension: Ramiprill was held at the time of admission secondary to clinical dehydration/hypokalemia. This is restarted at the time of discharge  Procedures:  None  Consultations:  Tamela Oddi of maxillofacial surgery  Discharge Exam: Filed Vitals:   04/11/13 0506  BP: 136/87  Pulse: 74  Temp: 98 F (36.7 C)  Resp: 20   General: Alert and awake, oriented x3, not in any acute distress. HEENT: anicteric sclera, pupils reactive to light and accommodation, EOMI CVS: S1-S2 clear, no murmur rubs or gallops Chest: clear to auscultation bilaterally, no wheezing, rales or rhonchi Abdomen: soft nontender, nondistended, normal bowel sounds, no organomegaly Extremities: no cyanosis, clubbing or edema noted bilaterally Neuro: Cranial nerves II-XII intact, no focal neurological deficits  Discharge Instructions     Medication List         amoxicillin-clavulanate 875-125 MG per tablet  Commonly known as:  AUGMENTIN  Take 1 tablet by mouth  2 (two) times daily.     aspirin 81 MG tablet  Take 81 mg by mouth daily.     atorvastatin 40 MG tablet  Commonly known as:  LIPITOR  Take 40 mg by mouth at bedtime.     exenatide 10 MCG/0.04ML Sopn injection  Commonly known as:  BYETTA  Inject 10 mcg into the skin daily.      ezetimibe 10 MG tablet  Commonly known as:  ZETIA  Take 10 mg by mouth daily.     insulin glargine 100 UNIT/ML injection  Commonly known as:  LANTUS  Inject 0.2 mLs (20 Units total) into the skin at bedtime.     insulin glulisine 100 UNIT/ML injection  Commonly known as:  APIDRA  Inject 20 Units into the skin daily.     metoprolol 100 MG tablet  Commonly known as:  LOPRESSOR  Take 100 mg by mouth 2 (two) times daily.     oxyCODONE-acetaminophen 5-325 MG per tablet  Commonly known as:  ROXICET  Take 1-2 tablets by mouth every 4 (four) hours as needed for severe pain.     pioglitazone-metformin 15-850 MG per tablet  Commonly known as:  ACTOPLUS MET  Take 1 tablet by mouth 2 (two) times daily with a meal.     pregabalin 100 MG capsule  Commonly known as:  LYRICA  Take 100 mg by mouth 2 (two) times daily.       Allergies  Allergen Reactions  . Morphine Other (See Comments)    Years ago. Hives. Pt states has been on this before with no reaction       Follow-up Information   Follow up with Sumter,CHRISTOPHER L, DDS In 1 week.   Specialty:  Oral Surgery   Contact information:   [place], [address] [place] [address] [phone]       Follow up with Dwan Bolt, MD In 1 week.   Specialty:  Endocrinology   Contact information:   [address] [place] [address] [phone]        The results of significant diagnostics from this hospitalization (including imaging, microbiology, ancillary and laboratory) are listed below for reference.    Significant Diagnostic Studies: Dg Fluoro Rm 1-60 Min  04/08/2013   CLINICAL DATA:  Suspected inflammatory joint effusion in the left temporomandibular joint. Rule out septic joint.  EXAM: FLOURO RM 1-60 MIN  MEDICATIONS AND MEDICAL HISTORY: None  ANESTHESIA/SEDATION: None  CONTRAST:  None  FLUOROSCOPY TIME:  49 seconds.  PROCEDURE: The procedure, risks, benefits, and alternatives were  explained to the patient. Questions regarding the procedure were encouraged and answered. The patient understands and consents to the procedure.  The left pre-auricular location was prepped with Betadine in a sterile fashion, and a sterile drape was applied covering the operative field. A sterile gown and sterile gloves were used for the procedure.  1% lidocaine was utilized for local anesthesia. Under fluoroscopic guidance, a 20 gauge needle was advanced into the left temporomandibular joint. 1 cc clear fluid was aspirated without complication.  COMPLICATIONS: None  FINDINGS: Image documents needle placement in the left temporomandibular joint.  IMPRESSION: Successful left temporomandibular joint aspiration yielding clear fluid. This was sent for culture.   Electronically Signed   By: Maryclare Bean M.D.   On: 04/08/2013 20:45   Ct Maxillofacial W/cm  04/08/2013   CLINICAL DATA:  Left facial swelling.  Rule out abscess.  EXAM: CT MAXILLOFACIAL WITH CONTRAST  TECHNIQUE: Multidetector  CT imaging of the maxillofacial structures was performed with intravenous contrast. Multiplanar CT image reconstructions were also generated. A small metallic BB was placed on the right temple in order to reliably differentiate right from left.  CONTRAST:  38m OMNIPAQUE IOHEXOL 300 MG/ML  SOLN  COMPARISON:  X-rays from earlier today  FINDINGS: Vitamin-E capsule marks an area of swelling overlying the left parotid gland. The left parotid gland is normal. No evidence of abscess or mass in the left parotid gland.  The left TMJ is abnormal. There is a joint effusion. There is a small bony fragment anterior to the condyle which could be a fracture fragment. The joint effusion could be due to the fracture. This also some mild erosive changes in the cortex and septic arthritis is not excluded. The joint space is widened with anterior subluxation of the left TMJ compared to the right TMJ which appears normal.  The right parotid gland is normal.  Submandibular gland is normal bilaterally. No mass or adenopathy. Visualized paranasal sinuses are clear.  IMPRESSION: Left parotid gland appears normal  Abnormal left TMJ with subluxation of the condyle. There is a joint effusion. There is a small bony fragment in the joint which may be due to acute fracture. Cannot exclude septic arthritis if there are clinical signs of infection.   Electronically Signed   By: CFranchot GalloM.D.   On: 04/08/2013 15:04   Dg Chest Port 1 View  04/08/2013   CLINICAL DATA:  Preop.  EXAM: PORTABLE CHEST - 1 VIEW  COMPARISON:  04/08/2013 at 12:31 p.m.  FINDINGS: Changes from CABG surgery are stable. The cardiac silhouette is mildly enlarged. Normal mediastinal and hilar contours. There is central vascular congestion and mild interstitial thickening. Additional lung base opacity is noted that is likely due to atelectasis. Allowing for differences in technique, there has been no significant change from the prior study. No pleural effusion or pneumothorax.  IMPRESSION: No convincing acute cardiopulmonary disease. Stable appearance from the earlier study.   Electronically Signed   By: DLajean ManesM.D.   On: 04/08/2013 18:44    Microbiology: Recent Results (from the past 240 hour(s))  CULTURE, BLOOD (ROUTINE X 2)     Status: None   Collection Time    04/08/13  6:15 PM      Result Value Range Status   Specimen Description BLOOD ARM LEFT   Final   Special Requests BOTTLES DRAWN AEROBIC ONLY 5CC   Final   Culture  Setup Time     Final   Value: 04/08/2013 23:16     Performed at [facility]  Culture     Final   Value:        BLOOD CULTURE RECEIVED NO GROWTH TO DATE CULTURE WILL BE HELD FOR 5 DAYS BEFORE ISSUING A FINAL NEGATIVE REPORT     Performed at [facility]  Report Status PENDING   Incomplete  CULTURE, BLOOD (ROUTINE X 2)     Status: None   Collection Time    04/08/13  6:21 PM      Result Value Range Status   Specimen Description BLOOD HAND  LEFT   Final   Special Requests BOTTLES DRAWN AEROBIC ONLY [place]  Final   Culture  Setup Time     Final   Value: 04/08/2013 23:16     Performed at [facility]  Culture     Final   Value:  BLOOD CULTURE RECEIVED NO GROWTH TO DATE CULTURE WILL BE HELD FOR 5 DAYS BEFORE ISSUING A FINAL NEGATIVE REPORT     Performed at [facility]   Report Status PENDING   Incomplete  CULTURE, ROUTINE-ABSCESS     Status: None   Collection Time    04/08/13  8:55 PM      Result Value Range Status   Specimen Description ABSCESS MANDIBLE   Final   Special Requests TMJ ASPIRATION   Final   Gram Stain     Final   Value: FEW WBC PRESENT,BOTH PMN AND MONONUCLEAR     NO SQUAMOUS EPITHELIAL CELLS SEEN     NO ORGANISMS SEEN     Performed at [facility]   Culture     Final   Value: NO GROWTH 2 DAYS     Performed at [facility]   Report Status PENDING   Incomplete     Labs: Basic Metabolic Panel:  Recent Labs Lab 04/08/13 1815 04/10/13 0531 04/11/13 0608  NA 143 138 144  K 5.3 4.5 4.5  CL 103 97 102  CO2 _0 GLUCOSE 127* 126* 60*  BUN _1 CREATININE 1.23 1.36* 1.35  CALCIUM 9.5 9.5 9.0   Liver Function Tests: No results found for this basename: AST, ALT, ALKPHOS, BILITOT, PROT, ALBUMIN,  in the last 168 hours No results found for this basename: LIPASE, AMYLASE,  in the last 168 hours No results found for this basename: AMMONIA,  in the last 168 hours CBC:  Recent Labs Lab 04/08/13 1815 04/10/13 0531 04/11/13 0608  WBC 14.3* 11.3* 7.7  NEUTROABS 11.7*  --   --   HGB 12.4* 12.1* 11.8*  HCT 38.2* 36.6* 35.5*  MCV 90.3 89.1 87.4  PLT 334 329 353   Cardiac Enzymes: No results found for this basename: CKTOTAL, CKMB, CKMBINDEX, TROPONINI,  in the last 168 hours BNP: BNP (last 3 results) No results found for this basename: PROBNP,  in the last 8760 hours CBG:  Recent Labs Lab 04/10/13 1156 04/10/13 1648 04/10/13 2200 04/11/13 0741  04/11/13 0810  GLUCAP 111* 139* 106* 53* 86       Signed:  Kytzia Gienger A  [facility] 04/11/2013, 1:08 PM

## 2013-04-12 LAB — CULTURE, ROUTINE-ABSCESS: Culture: NO GROWTH

## 2013-04-13 ENCOUNTER — Other Ambulatory Visit (HOSPITAL_COMMUNITY): Payer: Self-pay | Admitting: Oral and Maxillofacial Surgery

## 2013-04-13 DIAGNOSIS — G8929 Other chronic pain: Secondary | ICD-10-CM

## 2013-04-13 DIAGNOSIS — M26629 Arthralgia of temporomandibular joint, unspecified side: Secondary | ICD-10-CM

## 2013-04-13 DIAGNOSIS — T148XXA Other injury of unspecified body region, initial encounter: Secondary | ICD-10-CM

## 2013-04-13 DIAGNOSIS — R52 Pain, unspecified: Secondary | ICD-10-CM

## 2013-04-14 ENCOUNTER — Ambulatory Visit (HOSPITAL_COMMUNITY)
Admission: RE | Admit: 2013-04-14 | Discharge: 2013-04-14 | Disposition: A | Discharge status: Home / Self Care | Payer: Medicare Other | Source: Ambulatory Visit | Attending: Emergency Medicine | Admitting: Emergency Medicine

## 2013-04-14 ENCOUNTER — Other Ambulatory Visit (HOSPITAL_COMMUNITY): Payer: Self-pay | Admitting: Emergency Medicine

## 2013-04-14 DIAGNOSIS — M2548 Effusion, other site: Secondary | ICD-10-CM

## 2013-04-14 DIAGNOSIS — M26609 Unspecified temporomandibular joint disorder, unspecified side: Secondary | ICD-10-CM | POA: Insufficient documentation

## 2013-04-14 LAB — CULTURE, BLOOD (ROUTINE X 2)
CULTURE: NO GROWTH
Culture: NO GROWTH

## 2013-04-20 ENCOUNTER — Ambulatory Visit (HOSPITAL_COMMUNITY): Payer: Medicare Other

## 2014-04-23 ENCOUNTER — Other Ambulatory Visit: Payer: Self-pay | Admitting: Gastroenterology

## 2014-08-04 IMAGING — RF DG FLUORO GUIDE NDL PLC/BX
1 series · 1 of 1 positions shown · non-contrast
Comparison: none

ADDENDUM:
The examination should be changed to:  DG FLUORO GUIDE NDL SILULAMI/BX

The new report is as follows:
CLINICAL DATA: Suspected inflammatory joint effusion in the left
temporomandibular joint. Rule out septic joint.

[Series 1: run · 1 of 1 slices shown]
[im 1/1]
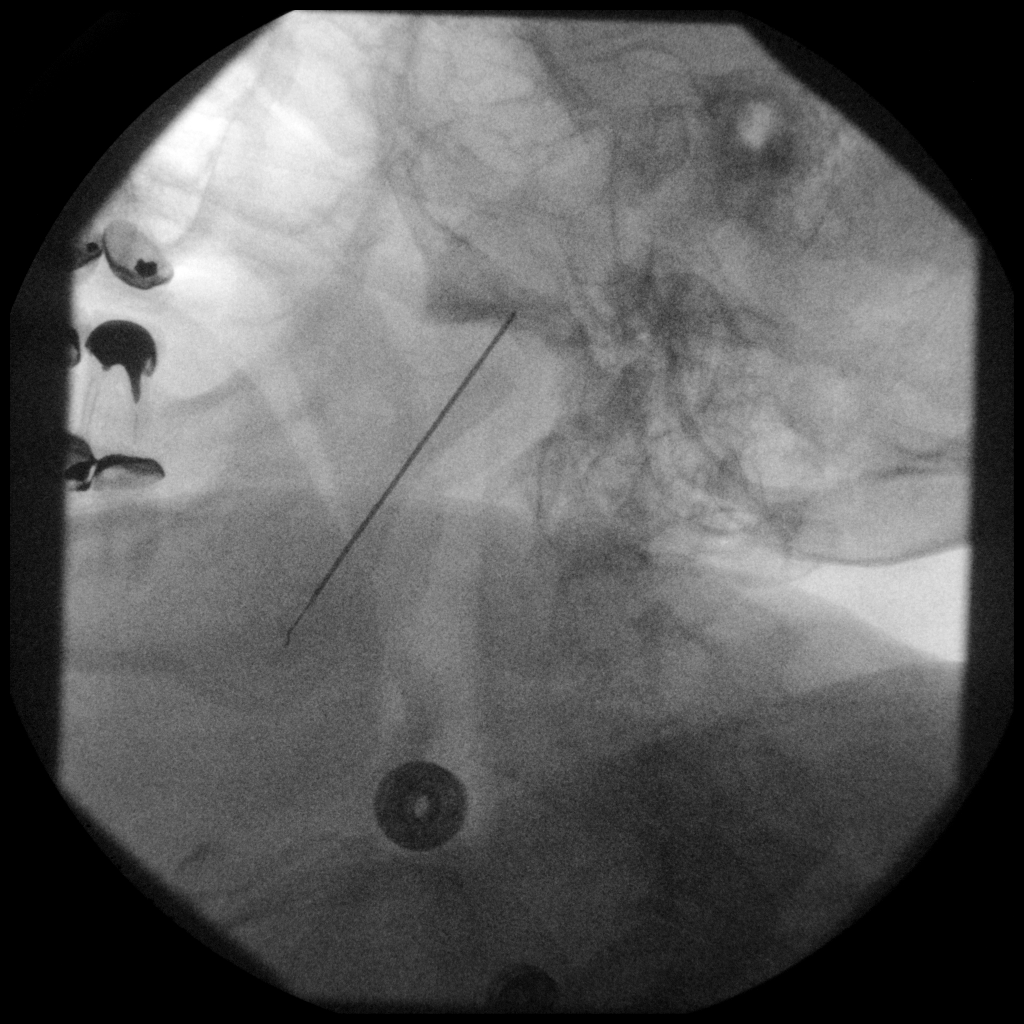

[1 of 1 positions shown; findings below may reference images not displayed]

EXAM:
DG FLUORO GUIDE NDL SILULAMI/BX

MEDICATIONS AND MEDICAL HISTORY:
None

ANESTHESIA/SEDATION:
None

PROCEDURE:

The procedure, risks, benefits, and alternatives were explained to
the patient. Questions regarding the procedure were encouraged and
answered. The patient understands and consents to the procedure.

The left pre-auricular location was prepped with Betadine in a
sterile fashion, and a sterile drape was applied covering the
operative field. A sterile gown and sterile gloves were used for the
procedure.

1% lidocaine was utilized for local anesthesia. Under fluoroscopic
guidance, a 20 gauge needle was advanced into the left
temporomandibular joint. 1 cc clear fluid was aspirated without
complication.

CONTRAST: None

FLUOROSCOPY TIME: 49 seconds.
FINDINGS: Image documents needle placement in the left temporomandibular
joint.

COMPLICATIONS:

None
IMPRESSION: Successful left temporomandibular joint aspiration yielding clear
fluid. This was sent for culture.
EXAM:
FLOURO RM 1-60 MIN

MEDICATIONS AND MEDICAL HISTORY:
None

ANESTHESIA/SEDATION:
None

PROCEDURE:

The procedure, risks, benefits, and alternatives were explained to
the patient. Questions regarding the procedure were encouraged and
answered. The patient understands and consents to the procedure.

The left pre-auricular location was prepped with Betadine in a
sterile fashion, and a sterile drape was applied covering the
operative field. A sterile gown and sterile gloves were used for the
procedure.

1% lidocaine was utilized for local anesthesia. Under fluoroscopic
guidance, a 20 gauge needle was advanced into the left
temporomandibular joint. 1 cc clear fluid was aspirated without
complication.

CONTRAST:  None

FLUOROSCOPY TIME:  49 seconds.
FINDINGS: Image documents needle placement in the left temporomandibular
joint.

COMPLICATIONS:

None
IMPRESSION: Successful left temporomandibular joint aspiration yielding clear
fluid. This was sent for culture.

## 2014-08-04 IMAGING — DX DG CHEST 1V PORT
1 series · 1 of 1 positions shown · non-contrast
Comparison: 04/08/2013 at [DATE] p.m.

CLINICAL DATA: Preop.

EXAM:
PORTABLE CHEST - 1 VIEW

[portable]
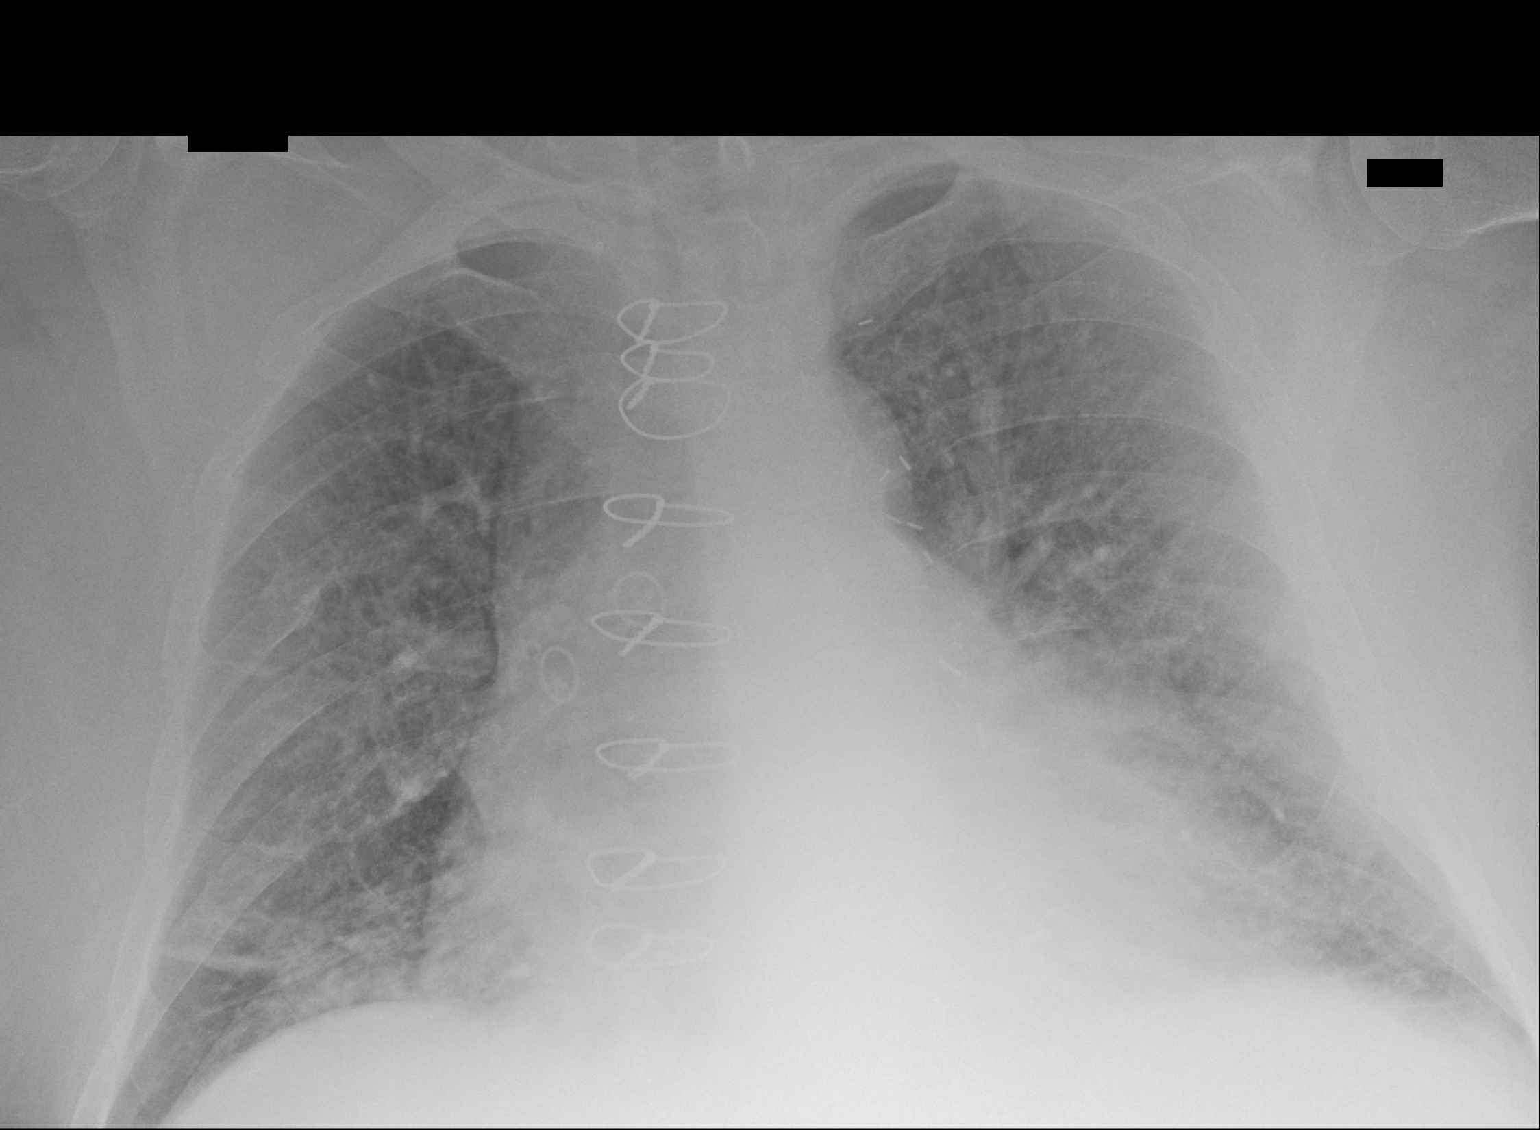

[1 of 1 positions shown; findings below may reference images not displayed]

FINDINGS: Changes from CABG surgery are stable. The cardiac silhouette is
mildly enlarged. Normal mediastinal and hilar contours. There is
central vascular congestion and mild interstitial thickening.
Additional lung base opacity is noted that is likely due to
atelectasis. Allowing for differences in technique, there has been
no significant change from the prior study. No pleural effusion or
pneumothorax.
IMPRESSION: No convincing acute cardiopulmonary disease. Stable appearance from
the earlier study.

## 2014-09-29 ENCOUNTER — Encounter: Payer: Self-pay | Admitting: Gastroenterology

## 2015-09-30 ENCOUNTER — Other Ambulatory Visit: Payer: Self-pay | Admitting: Nephrology

## 2015-09-30 DIAGNOSIS — N183 Chronic kidney disease, stage 3 unspecified: Secondary | ICD-10-CM

## 2015-11-03 ENCOUNTER — Ambulatory Visit
Admission: RE | Admit: 2015-11-03 | Discharge: 2015-11-03 | Disposition: A | Discharge status: Home / Self Care | Payer: Medicare Other | Source: Ambulatory Visit | Attending: Nephrology | Admitting: Nephrology

## 2015-11-03 DIAGNOSIS — N183 Chronic kidney disease, stage 3 unspecified: Secondary | ICD-10-CM

## 2016-02-21 ENCOUNTER — Other Ambulatory Visit (HOSPITAL_COMMUNITY): Payer: Self-pay | Admitting: Respiratory Therapy

## 2016-02-21 DIAGNOSIS — J441 Chronic obstructive pulmonary disease with (acute) exacerbation: Secondary | ICD-10-CM

## 2016-06-17 DEATH — deceased
